# Patient Record
Sex: Male | Born: 1935 | Race: White | Hispanic: No | Smoking: Former smoker
Health system: Southern US, Community
[De-identification: ages and names within clinical notes are randomized; demographics above are authoritative.]

## PROBLEM LIST (undated history)

## (undated) DIAGNOSIS — E039 Hypothyroidism, unspecified: Secondary | ICD-10-CM

## (undated) DIAGNOSIS — I10 Essential (primary) hypertension: Secondary | ICD-10-CM

## (undated) DIAGNOSIS — I3139 Other pericardial effusion (noninflammatory): Secondary | ICD-10-CM

## (undated) DIAGNOSIS — N183 Chronic kidney disease, stage 3 unspecified: Secondary | ICD-10-CM

## (undated) DIAGNOSIS — Z9289 Personal history of other medical treatment: Secondary | ICD-10-CM

## (undated) DIAGNOSIS — D649 Anemia, unspecified: Secondary | ICD-10-CM

## (undated) DIAGNOSIS — N182 Chronic kidney disease, stage 2 (mild): Secondary | ICD-10-CM

## (undated) DIAGNOSIS — M1990 Unspecified osteoarthritis, unspecified site: Secondary | ICD-10-CM

## (undated) DIAGNOSIS — I313 Pericardial effusion (noninflammatory): Secondary | ICD-10-CM

## (undated) DIAGNOSIS — J189 Pneumonia, unspecified organism: Secondary | ICD-10-CM

## (undated) DIAGNOSIS — E7800 Pure hypercholesterolemia, unspecified: Secondary | ICD-10-CM

## (undated) DIAGNOSIS — I314 Cardiac tamponade: Secondary | ICD-10-CM

## (undated) DIAGNOSIS — J449 Chronic obstructive pulmonary disease, unspecified: Secondary | ICD-10-CM

## (undated) DIAGNOSIS — Z9981 Dependence on supplemental oxygen: Secondary | ICD-10-CM

## (undated) HISTORY — DX: Chronic kidney disease, stage 3 unspecified: N18.30

## (undated) HISTORY — DX: Cardiac tamponade: I31.4

## (undated) HISTORY — PX: FRACTURE SURGERY: SHX138

## (undated) HISTORY — PX: MANDIBULAR HARDWARE REMOVAL: SHX5205

## (undated) HISTORY — DX: Pericardial effusion (noninflammatory): I31.3

## (undated) HISTORY — DX: Other pericardial effusion (noninflammatory): I31.39

## (undated) HISTORY — PX: CATARACT EXTRACTION W/ INTRAOCULAR LENS IMPLANT: SHX1309

---

## 1978-08-07 HISTORY — PX: ORIF MANDIBULAR FRACTURE: SHX2127

## 2015-08-08 DIAGNOSIS — Z9289 Personal history of other medical treatment: Secondary | ICD-10-CM | POA: Insufficient documentation

## 2015-08-08 HISTORY — DX: Personal history of other medical treatment: Z92.89

## 2016-05-01 DIAGNOSIS — E785 Hyperlipidemia, unspecified: Secondary | ICD-10-CM | POA: Insufficient documentation

## 2016-05-01 HISTORY — DX: Hyperlipidemia, unspecified: E78.5

## 2018-02-07 DIAGNOSIS — E039 Hypothyroidism, unspecified: Secondary | ICD-10-CM

## 2018-02-07 DIAGNOSIS — I10 Essential (primary) hypertension: Secondary | ICD-10-CM | POA: Diagnosis not present

## 2018-02-07 DIAGNOSIS — R5383 Other fatigue: Secondary | ICD-10-CM

## 2018-02-07 DIAGNOSIS — J9601 Acute respiratory failure with hypoxia: Secondary | ICD-10-CM

## 2018-02-07 DIAGNOSIS — N183 Chronic kidney disease, stage 3 (moderate): Secondary | ICD-10-CM

## 2018-02-07 DIAGNOSIS — J181 Lobar pneumonia, unspecified organism: Secondary | ICD-10-CM

## 2018-02-08 DIAGNOSIS — N183 Chronic kidney disease, stage 3 (moderate): Secondary | ICD-10-CM | POA: Diagnosis not present

## 2018-02-08 DIAGNOSIS — I10 Essential (primary) hypertension: Secondary | ICD-10-CM | POA: Diagnosis not present

## 2018-02-08 DIAGNOSIS — E039 Hypothyroidism, unspecified: Secondary | ICD-10-CM | POA: Diagnosis not present

## 2018-02-08 DIAGNOSIS — R5383 Other fatigue: Secondary | ICD-10-CM | POA: Diagnosis not present

## 2018-02-09 DIAGNOSIS — E039 Hypothyroidism, unspecified: Secondary | ICD-10-CM | POA: Diagnosis not present

## 2018-02-09 DIAGNOSIS — I10 Essential (primary) hypertension: Secondary | ICD-10-CM | POA: Diagnosis not present

## 2018-02-09 DIAGNOSIS — N183 Chronic kidney disease, stage 3 (moderate): Secondary | ICD-10-CM | POA: Diagnosis not present

## 2018-02-09 DIAGNOSIS — R5383 Other fatigue: Secondary | ICD-10-CM | POA: Diagnosis not present

## 2018-02-10 DIAGNOSIS — I10 Essential (primary) hypertension: Secondary | ICD-10-CM | POA: Diagnosis not present

## 2018-02-10 DIAGNOSIS — E039 Hypothyroidism, unspecified: Secondary | ICD-10-CM | POA: Diagnosis not present

## 2018-02-10 DIAGNOSIS — R5383 Other fatigue: Secondary | ICD-10-CM | POA: Diagnosis not present

## 2018-02-10 DIAGNOSIS — N183 Chronic kidney disease, stage 3 (moderate): Secondary | ICD-10-CM | POA: Diagnosis not present

## 2018-05-07 ENCOUNTER — Inpatient Hospital Stay (HOSPITAL_COMMUNITY)
Admission: AD | Disposition: A | Discharge status: Home / Self Care | Payer: Self-pay | Source: Other Acute Inpatient Hospital | Attending: Internal Medicine

## 2018-05-07 ENCOUNTER — Encounter (HOSPITAL_COMMUNITY): Payer: Self-pay | Admitting: Internal Medicine

## 2018-05-07 ENCOUNTER — Inpatient Hospital Stay (HOSPITAL_COMMUNITY): Payer: Medicare HMO

## 2018-05-07 ENCOUNTER — Inpatient Hospital Stay (HOSPITAL_COMMUNITY)
Admission: AD | Admit: 2018-05-07 | Discharge: 2018-05-09 | DRG: 315 | Disposition: A | Discharge status: Home / Self Care | Payer: Medicare HMO | Source: Other Acute Inpatient Hospital | Attending: Internal Medicine | Admitting: Internal Medicine

## 2018-05-07 ENCOUNTER — Other Ambulatory Visit: Payer: Self-pay

## 2018-05-07 DIAGNOSIS — N183 Chronic kidney disease, stage 3 (moderate): Secondary | ICD-10-CM | POA: Diagnosis present

## 2018-05-07 DIAGNOSIS — I129 Hypertensive chronic kidney disease with stage 1 through stage 4 chronic kidney disease, or unspecified chronic kidney disease: Secondary | ICD-10-CM | POA: Diagnosis present

## 2018-05-07 DIAGNOSIS — I4891 Unspecified atrial fibrillation: Secondary | ICD-10-CM | POA: Diagnosis present

## 2018-05-07 DIAGNOSIS — Z9981 Dependence on supplemental oxygen: Secondary | ICD-10-CM | POA: Diagnosis not present

## 2018-05-07 DIAGNOSIS — I10 Essential (primary) hypertension: Secondary | ICD-10-CM | POA: Diagnosis not present

## 2018-05-07 DIAGNOSIS — Z87891 Personal history of nicotine dependence: Secondary | ICD-10-CM

## 2018-05-07 DIAGNOSIS — I313 Pericardial effusion (noninflammatory): Secondary | ICD-10-CM

## 2018-05-07 DIAGNOSIS — Z8249 Family history of ischemic heart disease and other diseases of the circulatory system: Secondary | ICD-10-CM | POA: Diagnosis not present

## 2018-05-07 DIAGNOSIS — J449 Chronic obstructive pulmonary disease, unspecified: Secondary | ICD-10-CM | POA: Diagnosis present

## 2018-05-07 DIAGNOSIS — Z79890 Hormone replacement therapy (postmenopausal): Secondary | ICD-10-CM

## 2018-05-07 DIAGNOSIS — Y636 Underdosing and nonadministration of necessary drug, medicament or biological substance: Secondary | ICD-10-CM | POA: Diagnosis not present

## 2018-05-07 DIAGNOSIS — E039 Hypothyroidism, unspecified: Secondary | ICD-10-CM | POA: Diagnosis present

## 2018-05-07 DIAGNOSIS — N179 Acute kidney failure, unspecified: Secondary | ICD-10-CM | POA: Diagnosis present

## 2018-05-07 DIAGNOSIS — I3139 Other pericardial effusion (noninflammatory): Secondary | ICD-10-CM

## 2018-05-07 DIAGNOSIS — Z961 Presence of intraocular lens: Secondary | ICD-10-CM | POA: Diagnosis not present

## 2018-05-07 DIAGNOSIS — I314 Cardiac tamponade: Secondary | ICD-10-CM | POA: Diagnosis present

## 2018-05-07 DIAGNOSIS — Z9841 Cataract extraction status, right eye: Secondary | ICD-10-CM | POA: Diagnosis not present

## 2018-05-07 DIAGNOSIS — E785 Hyperlipidemia, unspecified: Secondary | ICD-10-CM | POA: Diagnosis present

## 2018-05-07 DIAGNOSIS — G4700 Insomnia, unspecified: Secondary | ICD-10-CM | POA: Diagnosis present

## 2018-05-07 DIAGNOSIS — J9611 Chronic respiratory failure with hypoxia: Secondary | ICD-10-CM | POA: Diagnosis present

## 2018-05-07 DIAGNOSIS — I480 Paroxysmal atrial fibrillation: Secondary | ICD-10-CM | POA: Diagnosis not present

## 2018-05-07 DIAGNOSIS — Z79899 Other long term (current) drug therapy: Secondary | ICD-10-CM

## 2018-05-07 HISTORY — DX: Pericardial effusion (noninflammatory): I31.3

## 2018-05-07 HISTORY — DX: Essential (primary) hypertension: I10

## 2018-05-07 HISTORY — DX: Dependence on supplemental oxygen: Z99.81

## 2018-05-07 HISTORY — DX: Personal history of other medical treatment: Z92.89

## 2018-05-07 HISTORY — DX: Pneumonia, unspecified organism: J18.9

## 2018-05-07 HISTORY — DX: Other pericardial effusion (noninflammatory): I31.39

## 2018-05-07 HISTORY — DX: Anemia, unspecified: D64.9

## 2018-05-07 HISTORY — DX: Chronic obstructive pulmonary disease, unspecified: J44.9

## 2018-05-07 HISTORY — DX: Pure hypercholesterolemia, unspecified: E78.00

## 2018-05-07 HISTORY — DX: Hypothyroidism, unspecified: E03.9

## 2018-05-07 HISTORY — PX: PERICARDIOCENTESIS: CATH118255

## 2018-05-07 HISTORY — DX: Unspecified osteoarthritis, unspecified site: M19.90

## 2018-05-07 HISTORY — DX: Chronic kidney disease, stage 2 (mild): N18.2

## 2018-05-07 LAB — BODY FLUID CELL COUNT WITH DIFFERENTIAL
Eos, Fluid: 0 %
Lymphs, Fluid: 25 %
Monocyte-Macrophage-Serous Fluid: 42 % — ABNORMAL LOW (ref 50–90)
Neutrophil Count, Fluid: 33 % — ABNORMAL HIGH (ref 0–25)
Total Nucleated Cell Count, Fluid: 235 cu mm (ref 0–1000)

## 2018-05-07 LAB — ECHOCARDIOGRAM LIMITED

## 2018-05-07 LAB — GRAM STAIN

## 2018-05-07 LAB — T4, FREE: Free T4: 0.54 ng/dL — ABNORMAL LOW (ref 0.82–1.77)

## 2018-05-07 LAB — TSH: TSH: 33.739 u[IU]/mL — AB (ref 0.350–4.500)

## 2018-05-07 LAB — MRSA PCR SCREENING: MRSA by PCR: NEGATIVE

## 2018-05-07 SURGERY — PERICARDIOCENTESIS
Anesthesia: LOCAL

## 2018-05-07 MED ORDER — AMLODIPINE BESYLATE 5 MG PO TABS
5.0000 mg | ORAL_TABLET | Freq: Every day | ORAL | Status: DC
  Administered 2018-05-08 – 2018-05-09 (×2): 5 mg via ORAL
  Filled 2018-05-07 (×2): qty 1

## 2018-05-07 MED ORDER — FOLIC ACID 1 MG PO TABS
1.0000 mg | ORAL_TABLET | Freq: Every day | ORAL | Status: DC
  Administered 2018-05-08 – 2018-05-09 (×2): 1 mg via ORAL
  Filled 2018-05-07 (×2): qty 1

## 2018-05-07 MED ORDER — SODIUM CHLORIDE 0.9 % IV SOLN: INTRAVENOUS | Status: DC

## 2018-05-07 MED ORDER — LACTATED RINGERS IV SOLN
INTRAVENOUS | Status: DC
  Administered 2018-05-07 – 2018-05-08 (×3): via INTRAVENOUS
  Administered 2018-05-09: 1000 mL via INTRAVENOUS

## 2018-05-07 MED ORDER — DOCUSATE SODIUM 100 MG PO CAPS
100.0000 mg | ORAL_CAPSULE | Freq: Two times a day (BID) | ORAL | Status: DC
  Administered 2018-05-08 (×2): 100 mg via ORAL
  Filled 2018-05-07 (×3): qty 1

## 2018-05-07 MED ORDER — ONDANSETRON HCL 4 MG PO TABS: 4.0000 mg | ORAL_TABLET | Freq: Four times a day (QID) | ORAL | Status: DC | PRN

## 2018-05-07 MED ORDER — ACETAMINOPHEN 650 MG RE SUPP: 650.0000 mg | Freq: Four times a day (QID) | RECTAL | Status: DC | PRN

## 2018-05-07 MED ORDER — ORAL CARE MOUTH RINSE
15.0000 mL | Freq: Two times a day (BID) | OROMUCOSAL | Status: DC
  Administered 2018-05-07 – 2018-05-09 (×4): 15 mL via OROMUCOSAL

## 2018-05-07 MED ORDER — MIDAZOLAM HCL 2 MG/2ML IJ SOLN
INTRAMUSCULAR | Status: AC
  Filled 2018-05-07: qty 2

## 2018-05-07 MED ORDER — SODIUM CHLORIDE 0.9% FLUSH
3.0000 mL | Freq: Two times a day (BID) | INTRAVENOUS | Status: DC
  Administered 2018-05-07 – 2018-05-09 (×4): 3 mL via INTRAVENOUS

## 2018-05-07 MED ORDER — MIDAZOLAM HCL 2 MG/2ML IJ SOLN
INTRAMUSCULAR | Status: DC | PRN
  Administered 2018-05-07: 1 mg via INTRAVENOUS

## 2018-05-07 MED ORDER — HEPARIN (PORCINE) IN NACL 1000-0.9 UT/500ML-% IV SOLN
INTRAVENOUS | Status: AC
  Filled 2018-05-07: qty 500

## 2018-05-07 MED ORDER — LIDOCAINE HCL (PF) 1 % IJ SOLN
INTRAMUSCULAR | Status: DC | PRN
  Administered 2018-05-07: 10 mL

## 2018-05-07 MED ORDER — LIDOCAINE HCL (PF) 1 % IJ SOLN
INTRAMUSCULAR | Status: AC
  Filled 2018-05-07: qty 30

## 2018-05-07 MED ORDER — SODIUM CHLORIDE 0.9 % IV SOLN: 250.0000 mL | INTRAVENOUS | Status: DC | PRN

## 2018-05-07 MED ORDER — ALBUTEROL SULFATE (2.5 MG/3ML) 0.083% IN NEBU: 3.0000 mL | INHALATION_SOLUTION | Freq: Four times a day (QID) | RESPIRATORY_TRACT | Status: DC | PRN

## 2018-05-07 MED ORDER — LEVOTHYROXINE SODIUM 112 MCG PO TABS
112.0000 ug | ORAL_TABLET | Freq: Every day | ORAL | Status: DC
  Administered 2018-05-08 – 2018-05-09 (×2): 112 ug via ORAL
  Filled 2018-05-07 (×2): qty 1

## 2018-05-07 MED ORDER — SODIUM CHLORIDE 0.9% FLUSH: 3.0000 mL | INTRAVENOUS | Status: DC | PRN

## 2018-05-07 MED ORDER — ONDANSETRON HCL 4 MG/2ML IJ SOLN: 4.0000 mg | Freq: Four times a day (QID) | INTRAMUSCULAR | Status: DC | PRN

## 2018-05-07 MED ORDER — ACETAMINOPHEN 325 MG PO TABS: 650.0000 mg | ORAL_TABLET | Freq: Four times a day (QID) | ORAL | Status: DC | PRN

## 2018-05-07 SURGICAL SUPPLY — 4 items
EVACUATOR 1/8 PVC DRAIN (DRAIN) ×2 IMPLANT
PACK CARDIAC CATHETERIZATION (CUSTOM PROCEDURE TRAY) ×2 IMPLANT
PERIVAC PERICARDIOCENTESIS 8.3 (TRAY / TRAY PROCEDURE) ×2 IMPLANT
SHEATH PROBE COVER 6X72 (BAG) ×2 IMPLANT
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
## 2018-05-07 NOTE — Progress Notes (Signed)
Pt arrived via [facility]. Pt on o2 via [facility] @ 2 lpm. Sat 92. Vitals stable. CCMD notified telebox 14 applied. CHG bath given. Pt belongings at bedside with son. Keith Nelson

## 2018-05-07 NOTE — Progress Notes (Signed)
  Echocardiogram 2D Echocardiogram has been performed.  Keith Nelson 05/07/2018, 6:43 PM

## 2018-05-07 NOTE — H&P (View-Only) (Signed)
Cardiology Consultation:   Patient ID: Keith Nelson MRN: [id number]; DOB: 09/20/1929  Admit date: 05/07/2018 Date of Consult: 05/07/2018  Primary Care Provider: York, Regina F, NP Primary Cardiologist: Robert Krasowski, MD  Primary Electrophysiologist:  None    Patient Profile:   Keith Nelson is a 84 y.o. male with a hx of COPD, HLD and HTN who is being seen today for the evaluation of pericardial effusion at the request of Dr. Yates.  History of Present Illness:   Keith Nelson was last seen by Dr. Krasowski in clinic on 05/01/16. At that time, he underwent CT chest for chest pain and found to have "significant" pericardial effusion. Options were discussed with the patient in clinic, including drain placement. The patient preferred conservative management with repeat echo in 1 month. He had no signs of tamponade in clinic. I can't see these results in Care Everywhere.  Home meds include norvasc and ASA 325 mg. He is a former smoker.   Pt presented to [facility] today with a 1 month history of insomnia and anorexia. Thyroid hormone abnormal because he stopped taking his medication about a month ago (accidentally). He restarted medication last weekend. He has a history of PNA in July of this year. Since discharge, he has had increased shortness of breath and productive cough.  He has chest tightness when he coughs and discharges sputum. He reported to [facility] because he thought he was having a recurrence of PNA. CT chest there showed pericardial effusion and he was transferred to [facility].   On my interview, his blood pressure has been elevated. He reports chest tightness associated with cough.    Past Medical History:  Diagnosis Date  . Anemia   . COPD (chronic obstructive pulmonary disease) (HCC)   . Hypertension   . Hypothyroidism   . Pericardial effusion 05/07/2018    Past Surgical History:  Procedure Laterality Date  . ORIF MANDIBULAR FRACTURE        Home Medications:  Prior to Admission medications   Medication Sig Start Date End Date Taking? Authorizing Provider  amLODipine (NORVASC) 5 MG tablet Take 5 mg by mouth daily.   Yes [provider]  levothyroxine (SYNTHROID, LEVOTHROID) 100 MCG tablet Take 100 mcg by mouth daily before breakfast.   Yes [provider]    Inpatient Medications: Scheduled Meds: . docusate sodium  100 mg Oral BID  . sodium chloride flush  3 mL Intravenous Q12H   Continuous Infusions: . lactated ringers     PRN Meds: acetaminophen **OR** acetaminophen, ondansetron **OR** ondansetron (ZOFRAN) IV  Allergies:   Allergies not on file  Social History:   Social History   Socioeconomic History  . Marital status: Widowed    Spouse name: Not on file  . Number of children: Not on file  . Years of education: Not on file  . Highest education level: Not on file  Occupational History  . Occupation: drives bus  Social Needs  . Financial resource strain: Not on file  . Food insecurity:    Worry: Not on file    Inability: Not on file  . Transportation needs:    Medical: Not on file    Non-medical: Not on file  Tobacco Use  . Smoking status: Former Smoker    Last attempt to quit: 1980    Years since quitting: 39.7  . Smokeless tobacco: Never Used  Substance and Sexual Activity  . Alcohol use: Never    Frequency: Never  .   Drug use: Never  . Sexual activity: Not on file  Lifestyle  . Physical activity:    Days per week: Not on file    Minutes per session: Not on file  . Stress: Not on file  Relationships  . Social connections:    Talks on phone: Not on file    Gets together: Not on file    Attends religious service: Not on file    Active member of club or organization: Not on file    Attends meetings of clubs or organizations: Not on file    Relationship status: Not on file  . Intimate partner violence:    Fear of current or ex partner: Not on file    Emotionally abused:  Not on file    Physically abused: Not on file    Forced sexual activity: Not on file  Other Topics Concern  . Not on file  Social History Narrative  . Not on file    Family History:    Family History  Problem Relation Age of Onset  . Heart failure Mother      ROS:  Please see the history of present illness.   All other ROS reviewed and negative.     Physical Exam/Data:   Vitals:   05/07/18 1448 05/07/18 1635  BP: (!) 167/93 (!) 178/94  Pulse: 79 76  Resp: 18 16  Temp: 97.9 F (36.6 C) (!) 97.5 F (36.4 C)  TempSrc: Oral Oral  SpO2: 92% 90%    Intake/Output Summary (Last 24 hours) at 05/07/2018 1707 Last data filed at 05/07/2018 1637 Gross per 24 hour  Intake -  Output 400 ml  Net -400 ml   There were no vitals filed for this visit. There is no height or weight on file to calculate BMI.  General:  Well nourished, well developed, in no acute distress HEENT: normal Neck: no JVD Vascular: No carotid bruits Cardiac:  normal S1, S2; RRR; no murmur Lungs:  Respirations are unlabored, scattered wheezes in bases Abd: soft, nontender, no hepatomegaly  Ext: no edema Musculoskeletal:  No deformities, BUE and BLE strength normal and equal Skin: warm and dry  Neuro:  CNs 2-12 intact, no focal abnormalities noted Psych:  Normal affect   EKG:  The EKG was personally reviewed and demonstrates:  pending Telemetry:  Telemetry was personally reviewed and demonstrates:  Sinus with PVCs  Relevant CV Studies:  Echo pending read  Laboratory Data:  ChemistryNo results for input(s): NA, K, CL, CO2, GLUCOSE, BUN, CREATININE, CALCIUM, GFRNONAA, GFRAA, ANIONGAP in the last 168 hours.  No results for input(s): PROT, ALBUMIN, AST, ALT, ALKPHOS, BILITOT in the last 168 hours. HematologyNo results for input(s): WBC, RBC, HGB, HCT, MCV, MCH, MCHC, RDW, PLT in the last 168 hours. Cardiac EnzymesNo results for input(s): TROPONINI in the last 168 hours. No results for input(s): TROPIPOC  in the last 168 hours.  BNPNo results for input(s): BNP, PROBNP in the last 168 hours.  DDimer No results for input(s): DDIMER in the last 168 hours.  Radiology/Studies:  No results found.  Assessment and Plan:   1. Pericardial effusion, incidental finding on CT chest - echo final read pending - reviewed with attending - large pericardial effusion - pt hypertensive, sinus rhythm in the 80s - will benefit from pericardiocentesis - will discuss with attending - INR 1.2, Hb 14.2, on 325 mg ASA   2. Chest tightness with cough - pt states he has chest tightness with his productive cough, denies   chest tightness at other times - unclear if he has had a recent ischemic evaluation through [facility] - will workup effusion first, but may need an ischemic evaluation   3. Thyroid disease - he forgot to pick up his thyroid medicine and hasn't taken it in a month - he resumed last weekend - may be related to his insomnia, anorexia, and hypertension - per primary   4. Hypertension - home meds include norvasc - pressure 178/94 - hold off on PRN medications until after procedure    Plan for pericardiocentesis today.      For questions or updates, please contact [facility] Please consult [email or web address] for contact info under     Signed, Angela Nicole Duke, PA  05/07/2018 5:07 PM   I have examined the patient and reviewed assessment and plan and discussed with patient.  Agree with above as stated.  I personally reviewed the echo images.  Patient with evidence of RV diastolic collapse.  Risks and benefits of pericardiocentesis explained to the patient and family.  They are agreeable.    Patient is very hypertensive.  No BP meds until effusion drained as I do not want to risk inducing hypotension in the setting of tamponade.   D/w Dr. Gerhardt and Dr. Copper.  Pericardiocentesis would be a better choice rather than window to avoid general anesthesia.  Critical care time 40  minutes  Mujahid Jalomo   

## 2018-05-07 NOTE — Interval H&P Note (Signed)
History and Physical Interval Note:  05/07/2018 6:11 PM  Keith Nelson  has presented today for surgery, with the diagnosis of pericardiocentesis  The various methods of treatment have been discussed with the patient and family. After consideration of risks, benefits and other options for treatment, the patient has consented to  Procedure(s): PERICARDIOCENTESIS (N/A) as a surgical intervention .  The patient's history has been reviewed, patient examined, no change in status, stable for surgery.  I have reviewed the patient's chart and labs.  Questions were answered to the patient's satisfaction.     Tonny Bollman

## 2018-05-07 NOTE — Consult Note (Addendum)
Cardiology Consultation:   Patient ID: Keith Nelson MRN: [id number]; DOB: 09-27-1929  Admit date: 05/07/2018 Date of Consult: 05/07/2018  Primary Care Provider: [name], NP Primary Cardiologist: Gypsy Balsam, MD  Primary Electrophysiologist:  None    Patient Profile:   Keith Nelson is a 84 y.o. male with a hx of COPD, HLD and HTN who is being seen today for the evaluation of pericardial effusion at the request of Dr. Ophelia Charter.  History of Present Illness:   Keith Nelson was last seen by Dr. Bing Matter in clinic on 05/01/16. At that time, he underwent CT chest for chest pain and found to have "significant" pericardial effusion. Options were discussed with the patient in clinic, including drain placement. The patient preferred conservative management with repeat echo in 1 month. He had no signs of tamponade in clinic. I can't see these results in Care Everywhere.  Home meds include norvasc and ASA 325 mg. He is a former smoker.   Pt presented to [facility] today with a 1 month history of insomnia and anorexia. Thyroid hormone abnormal because he stopped taking his medication about a month ago (accidentally). He restarted medication last weekend. He has a history of PNA in July of this year. Since discharge, he has had increased shortness of breath and productive cough.  He has chest tightness when he coughs and discharges sputum. He reported to [facility] because he thought he was having a recurrence of PNA. CT chest there showed pericardial effusion and he was transferred to [facility].   On my interview, his blood pressure has been elevated. He reports chest tightness associated with cough.    Past Medical History:  Diagnosis Date  . Anemia   . COPD (chronic obstructive pulmonary disease) (HCC)   . Hypertension   . Hypothyroidism   . Pericardial effusion 05/07/2018    Past Surgical History:  Procedure Laterality Date  . ORIF MANDIBULAR FRACTURE        Home Medications:  Prior to Admission medications   Medication Sig Start Date End Date Taking? Authorizing Provider  amLODipine (NORVASC) 5 MG tablet Take 5 mg by mouth daily.   Yes [provider]  levothyroxine (SYNTHROID, LEVOTHROID) 100 MCG tablet Take 100 mcg by mouth daily before breakfast.   Yes [provider]    Inpatient Medications: Scheduled Meds: . docusate sodium  100 mg Oral BID  . sodium chloride flush  3 mL Intravenous Q12H   Continuous Infusions: . lactated ringers     PRN Meds: acetaminophen **OR** acetaminophen, ondansetron **OR** ondansetron (ZOFRAN) IV  Allergies:   Allergies not on file  Social History:   Social History   Socioeconomic History  . Marital status: Widowed    Spouse name: Not on file  . Number of children: Not on file  . Years of education: Not on file  . Highest education level: Not on file  Occupational History  . Occupation: drives bus  Social Needs  . Financial resource strain: Not on file  . Food insecurity:    Worry: Not on file    Inability: Not on file  . Transportation needs:    Medical: Not on file    Non-medical: Not on file  Tobacco Use  . Smoking status: Former Smoker    Last attempt to quit: 1980    Years since quitting: 39.7  . Smokeless tobacco: Never Used  Substance and Sexual Activity  . Alcohol use: Never    Frequency: Never  .  Drug use: Never  . Sexual activity: Not on file  Lifestyle  . Physical activity:    Days per week: Not on file    Minutes per session: Not on file  . Stress: Not on file  Relationships  . Social connections:    Talks on phone: Not on file    Gets together: Not on file    Attends religious service: Not on file    Active member of club or organization: Not on file    Attends meetings of clubs or organizations: Not on file    Relationship status: Not on file  . Intimate partner violence:    Fear of current or ex partner: Not on file    Emotionally abused:  Not on file    Physically abused: Not on file    Forced sexual activity: Not on file  Other Topics Concern  . Not on file  Social History Narrative  . Not on file    Family History:    Family History  Problem Relation Age of Onset  . Heart failure Mother      ROS:  Please see the history of present illness.   All other ROS reviewed and negative.     Physical Exam/Data:   Vitals:   05/07/18 1448 05/07/18 1635  BP: (!) 167/93 (!) 178/94  Pulse: 79 76  Resp: 18 16  Temp: 97.9 F (36.6 C) (!) 97.5 F (36.4 C)  TempSrc: Oral Oral  SpO2: 92% 90%    Intake/Output Summary (Last 24 hours) at 05/07/2018 1707 Last data filed at 05/07/2018 1637 Gross per 24 hour  Intake -  Output 400 ml  Net -400 ml   There were no vitals filed for this visit. There is no height or weight on file to calculate BMI.  General:  Well nourished, well developed, in no acute distress HEENT: normal Neck: no JVD Vascular: No carotid bruits Cardiac:  normal S1, S2; RRR; no murmur Lungs:  Respirations are unlabored, scattered wheezes in bases Abd: soft, nontender, no hepatomegaly  Ext: no edema Musculoskeletal:  No deformities, BUE and BLE strength normal and equal Skin: warm and dry  Neuro:  CNs 2-12 intact, no focal abnormalities noted Psych:  Normal affect   EKG:  The EKG was personally reviewed and demonstrates:  pending Telemetry:  Telemetry was personally reviewed and demonstrates:  Sinus with PVCs  Relevant CV Studies:  Echo pending read  Laboratory Data:  ChemistryNo results for input(s): NA, K, CL, CO2, GLUCOSE, BUN, CREATININE, CALCIUM, GFRNONAA, GFRAA, ANIONGAP in the last 168 hours.  No results for input(s): PROT, ALBUMIN, AST, ALT, ALKPHOS, BILITOT in the last 168 hours. HematologyNo results for input(s): WBC, RBC, HGB, HCT, MCV, MCH, MCHC, RDW, PLT in the last 168 hours. Cardiac EnzymesNo results for input(s): TROPONINI in the last 168 hours. No results for input(s): TROPIPOC  in the last 168 hours.  BNPNo results for input(s): BNP, PROBNP in the last 168 hours.  DDimer No results for input(s): DDIMER in the last 168 hours.  Radiology/Studies:  No results found.  Assessment and Plan:   1. Pericardial effusion, incidental finding on CT chest - echo final read pending - reviewed with attending - large pericardial effusion - pt hypertensive, sinus rhythm in the 80s - will benefit from pericardiocentesis - will discuss with attending - INR 1.2, Hb 14.2, on 325 mg ASA   2. Chest tightness with cough - pt states he has chest tightness with his productive cough, denies  chest tightness at other times - unclear if he has had a recent ischemic evaluation through [facility] - will workup effusion first, but may need an ischemic evaluation   3. Thyroid disease - he forgot to pick up his thyroid medicine and hasn't taken it in a month - he resumed last weekend - may be related to his insomnia, anorexia, and hypertension - per primary   4. Hypertension - home meds include norvasc - pressure 178/94 - hold off on PRN medications until after procedure    Plan for pericardiocentesis today.      For questions or updates, please contact [facility] Please consult [email or web address] for contact info under     Signed, Marcelino Duster, PA  05/07/2018 5:07 PM   I have examined the patient and reviewed assessment and plan and discussed with patient.  Agree with above as stated.  I personally reviewed the echo images.  Patient with evidence of RV diastolic collapse.  Risks and benefits of pericardiocentesis explained to the patient and family.  They are agreeable.    Patient is very hypertensive.  No BP meds until effusion drained as I do not want to risk inducing hypotension in the setting of tamponade.   D/w Dr. Tyrone Sage and Dr. Copper.  Pericardiocentesis would be a better choice rather than window to avoid general anesthesia.  Critical care time 40  minutes  Lance Muss

## 2018-05-07 NOTE — Progress Notes (Addendum)
  Echocardiogram 2D Echocardiogram has been performed.  Large pericardial effusion noticed.  Delcie Roch 05/07/2018, 4:33 PM

## 2018-05-08 ENCOUNTER — Encounter (HOSPITAL_COMMUNITY): Payer: Self-pay | Admitting: Cardiovascular Disease

## 2018-05-08 DIAGNOSIS — I314 Cardiac tamponade: Secondary | ICD-10-CM | POA: Diagnosis present

## 2018-05-08 DIAGNOSIS — J449 Chronic obstructive pulmonary disease, unspecified: Secondary | ICD-10-CM

## 2018-05-08 HISTORY — DX: Cardiac tamponade: I31.4

## 2018-05-08 LAB — BASIC METABOLIC PANEL
Anion gap: 11 (ref 5–15)
BUN: 14 mg/dL (ref 8–23)
CHLORIDE: 94 mmol/L — AB (ref 98–111)
CO2: 28 mmol/L (ref 22–32)
Calcium: 9.1 mg/dL (ref 8.9–10.3)
Creatinine, Ser: 1.36 mg/dL — ABNORMAL HIGH (ref 0.61–1.24)
GFR calc non Af Amer: 46 mL/min — ABNORMAL LOW (ref >60)
GFR, EST AFRICAN AMERICAN: 53 mL/min — AB (ref >60)
Glucose, Bld: 138 mg/dL — ABNORMAL HIGH (ref 70–99)
POTASSIUM: 4.1 mmol/L (ref 3.5–5.1)
Sodium: 133 mmol/L — ABNORMAL LOW (ref 135–145)

## 2018-05-08 LAB — CBC
HCT: 45.3 % (ref 39.0–52.0)
Hemoglobin: 14.5 g/dL (ref 13.0–17.0)
MCH: 29.5 pg (ref 26.0–34.0)
MCHC: 32 g/dL (ref 30.0–36.0)
MCV: 92.1 fL (ref 78.0–100.0)
PLATELETS: 201 10*3/uL (ref 150–400)
RBC: 4.92 MIL/uL (ref 4.22–5.81)
RDW: 13.7 % (ref 11.5–15.5)
WBC: 7.7 10*3/uL (ref 4.0–10.5)

## 2018-05-08 LAB — PH, BODY FLUID: PH, BODY FLUID: 7.7

## 2018-05-08 LAB — ECHOCARDIOGRAM LIMITED

## 2018-05-08 LAB — GLUCOSE, BODY FLUID OTHER: Glucose, Body Fluid Other: 101 mg/dL

## 2018-05-08 LAB — PROTEIN, BODY FLUID (OTHER): TOTAL PROTEIN, BODY FLUID OTHER: 6.9 g/dL

## 2018-05-08 LAB — LD, BODY FLUID (OTHER): LD, BODY FLUID: 165 IU/L

## 2018-05-08 MED FILL — Heparin Sod (Porcine)-NaCl IV Soln 1000 Unit/500ML-0.9%: INTRAVENOUS | Qty: 500 | Status: AC

## 2018-05-08 NOTE — Progress Notes (Addendum)
Progress Note  Patient Name: Keith Nelson Date of Encounter: 05/08/2018  Primary Cardiologist: Gypsy Balsam, MD  Subjective   Patient feeling better today. Chest pain has resolved. No SOB.  Inpatient Medications    Scheduled Meds: . amLODipine  5 mg Oral Daily  . docusate sodium  100 mg Oral BID  . folic acid  1 mg Oral Daily  . levothyroxine  112 mcg Oral QAC breakfast  . mouth rinse  15 mL Mouth Rinse BID  . sodium chloride flush  3 mL Intravenous Q12H  . sodium chloride flush  3 mL Intravenous Q12H   Continuous Infusions: . sodium chloride    . sodium chloride    . lactated ringers 75 mL/hr at 05/08/18 1000   PRN Meds: sodium chloride, acetaminophen **OR** acetaminophen, albuterol, ondansetron **OR** ondansetron (ZOFRAN) IV, sodium chloride flush   Vital Signs    Vitals:   05/08/18 0800 05/08/18 0900 05/08/18 0934 05/08/18 1000  BP: (!) 151/82 (!) 141/88 (!) 141/88 (!) 156/68  Pulse: 66 65  (!) 56  Resp: 18 (!) 26  15  Temp:      TempSrc:      SpO2: 90% 94%  91%  Weight:      Height:        Intake/Output Summary (Last 24 hours) at 05/08/2018 1117 Last data filed at 05/08/2018 1000 Gross per 24 hour  Intake 953.04 ml  Output 2045 ml  Net -1091.96 ml   Filed Weights   05/07/18 1812  Weight: 81 kg    Telemetry    NSR, low PVC burden, one 6 beat run of V-tach - Personally Reviewed  ECG    N/A - Personally Reviewed  Physical Exam   GEN: No acute distress.   Neck: No JVD Cardiac: RRR, no murmurs, rubs, or gallops; Pericardial drain in place Respiratory: Mild rhonchi and expiratory wheezes bilaterally GI: Soft, nontender, non-distended  MS: No edema; No deformity; clean dry pericardiocentesis site. Neuro:  Nonfocal  Psych: Normal affect   Labs    Chemistry Recent Labs  Lab 05/08/18 0219  NA 133*  K 4.1  CL 94*  CO2 28  GLUCOSE 138*  BUN 14  CREATININE 1.36*  CALCIUM 9.1  GFRNONAA 46*  GFRAA 53*  ANIONGAP 11      Hematology Recent Labs  Lab 05/08/18 0219  WBC 7.7  RBC 4.92  HGB 14.5  HCT 45.3  MCV 92.1  MCH 29.5  MCHC 32.0  RDW 13.7  PLT 201    Cardiac EnzymesNo results for input(s): TROPONINI in the last 168 hours. No results for input(s): TROPIPOC in the last 168 hours.   BNPNo results for input(s): BNP, PROBNP in the last 168 hours.   DDimer No results for input(s): DDIMER in the last 168 hours.   Radiology    No results found.  Cardiac Studies   Pericardiocentesis 10/1 Pericardiocentesis performed by the subxiphoid approach with fluoro and ultrasound. 1100 mL of serous fluid was removed from the pericardial cavity.  Echo 10/1 Study Conclusions - Left ventricle: Systolic function was mildly reduced. The   estimated ejection fraction was in the range of 45% to 50%. - Pericardium, extracardiac: A large pericardial effusion was   identified circumferential to the heart.  Patient Profile     84 y.o. male presented to [facility] with insomnia and was found to have pericardial effusion on CT. Large effusion verified on echo, now s/p pericardiocentesis on 10/1.  Assessment & Plan  1) Pericardial Effusion: Large effusion with tampanode physiology, now s/p pericardiocentesis. Draining yellow fluid with particulate matter; 80-100cc overnight. Etiology of effusion unclear. Chronic to some degree given history of effusion noted on CT in 2017. Thyroid disease may be contributing given chronicity and questionable disease control. Fluid cell count essentially normal, gram stain negative, no growth on culture. Fluid pH, LD, Protein and Glu pending. Chest tightness has resolved.  2) HTN: Improved today to 140s systolic. On home Norvasc.  3) Hypothyroidism: Out of meds for about 1 month, forgot to refill. TSH 33, free T4 0.5. Has resumed home synthroid.  For questions or updates, please contact [facility] Please consult [email or web address] for contact info under        Signed, Beola Cord, MD  05/08/2018, 11:17 AM    I have examined the patient and reviewed assessment and plan and discussed with patient.  Agree with above as stated.   Still with significant output from the drain. Receheck later today if output decreases.  Hopeful to be able to pull drain by tomorrow at the latest. Awaiting chemistries from the fluid.   Lance Muss

## 2018-05-08 NOTE — Progress Notes (Signed)
PROGRESS NOTE   Keith Nelson  ZOX:[id number]    DOB: 07-08-1930    DOA: 05/07/2018  PCP: [name], NP   I have briefly reviewed patients previous medical records in [facility]  Brief Narrative:  84 year old male, independent, brother lives with him, apparently works as a Hospital doctor, PMH of COPD, on as needed home oxygen at night, HLD, HTN, hypothyroid, chronic pericardial effusion known since 04/2016 managed conservatively, anemia, initially presented to [facility] with 1 month history of insomnia, anorexia, had stopped taking his thyroid medication a month ago and resumed recently, ongoing cough and progressive dyspnea since pneumonia in July this year and CT chest showed large pericardial effusion.  Patient was transferred to [facility], evaluated by cardiology and underwent pericardiocentesis of 1.1 L serous fluid 10/1.  Cardiology consulting.   Assessment & Plan:   Principal Problem:   Pericardial effusion Active Problems:   Essential hypertension   COPD (chronic obstructive pulmonary disease) (HCC)   Large pericardial effusion with early tamponade features by echo: Cardiology consulted and patient underwent successful pericardiocentesis of 1.1 L serous fluid 10/1.  Still has drain in place.  Clinically improved.  Follow-up on pericardial fluid results.  Etiology unclear.    Hypothyroid: Patient reportedly forgot to pick up his thyroid medications and did not take it for almost a month and resumed weekend prior to admission. Continue Synthroid and recheck TSH in 4 to 6 weeks. TSH 33.74 on 10/1  Essential hypertension: Mildly uncontrolled.  Continue amlodipine 5 mg daily.  Monitor and adjust as needed.  Chest tightness with cough: Not sure if this may have been related to his pericardial effusion.  Resolved at this time.  Monitor.  COPD: Stable.  Stage III chronic kidney disease: Baseline creatinine not known.  Follow BMP in a.m.   DVT prophylaxis: SCD's Code  Status: Full Family Communication: None at bedside Disposition: Pending clinical Improvement and cardiology clearance.   Consultants:  Cardiology   Procedures:  Pericardiocentesis 10/1  Antimicrobials:  None   Subjective: Feels much better and feels like he can go home. Denies cough or SOB. Min intermittent epigastric area pain.   ROS: As above  Objective:  Vitals:   05/08/18 0800 05/08/18 0900 05/08/18 0934 05/08/18 1000  BP: (!) 151/82 (!) 141/88 (!) 141/88 (!) 156/68  Pulse: 66 65  (!) 56  Resp: 18 (!) 26  15  Temp:      TempSrc:      SpO2: 90% 94%  91%  Weight:      Height:        Examination:  General exam: Pleasant elderly male, moderately built and nourished lying comfortably propped up in bed. Respiratory system: Clear to auscultation. Respiratory effort normal. Cardiovascular system: S1 & S2 heard, RRR. No JVD, murmurs, rubs, gallops or clicks. No pedal edema.Telemetry personally reviewed: SR-SB in the 55s.  No pericardial rub appreciated.  Pericardial drain in place. Gastrointestinal system: Abdomen is nondistended, soft and nontender. No organomegaly or masses felt. Normal bowel sounds heard. Central nervous system: Alert and oriented. No focal neurological deficits. Extremities: Symmetric 5 x 5 power. Skin: No rashes, lesions or ulcers Psychiatry: Judgement and insight appear normal. Mood & affect appropriate.     Data Reviewed: I have personally reviewed following labs and imaging studies  CBC: Recent Labs  Lab 05/08/18 0219  WBC 7.7  HGB 14.5  HCT 45.3  MCV 92.1  PLT 201   Basic Metabolic Panel: Recent Labs  Lab 05/08/18 0219  NA 133*  K 4.1  CL 94*  CO2 28  GLUCOSE 138*  BUN 14  CREATININE 1.36*  CALCIUM 9.1     Recent Results (from the past 240 hour(s))  Culture, body fluid-bottle     Status: None (Preliminary result)   Collection Time: 05/07/18  6:20 PM  Result Value Ref Range Status   Specimen Description FLUID  PERICARDIUM  Final   Special Requests BOTTLES DRAWN AEROBIC AND ANAEROBIC  Final   Culture   Final    NO GROWTH < 12 HOURS Performed at [facility], [address] [place], [place] [address]    Report Status PENDING  Incomplete  Gram stain     Status: None   Collection Time: 05/07/18  6:20 PM  Result Value Ref Range Status   Specimen Description FLUID PERICARDIUM  Final   Special Requests NONE  Final   Gram Stain   Final    WBC PRESENT,BOTH PMN AND MONONUCLEAR NO ORGANISMS SEEN CYTOSPIN SMEAR Performed at [facility], [address] [place], [place] [address]    Report Status 05/07/2018 FINAL  Final  MRSA PCR Screening     Status: None   Collection Time: 05/07/18  9:34 PM  Result Value Ref Range Status   MRSA by PCR NEGATIVE NEGATIVE Final    Comment:        The GeneXpert MRSA Assay (FDA approved for NASAL specimens only), is one component of a comprehensive MRSA colonization surveillance program. It is not intended to diagnose MRSA infection nor to guide or monitor treatment for MRSA infections. Performed at [facility], [address] [place], [place] [address]          Radiology Studies: No results found.      Scheduled Meds: . amLODipine  5 mg Oral Daily  . docusate sodium  100 mg Oral BID  . folic acid  1 mg Oral Daily  . levothyroxine  112 mcg Oral QAC breakfast  . mouth rinse  15 mL Mouth Rinse BID  . sodium chloride flush  3 mL Intravenous Q12H  . sodium chloride flush  3 mL Intravenous Q12H   Continuous Infusions: . sodium chloride    . sodium chloride    . lactated ringers 75 mL/hr at 05/07/18 1716     LOS: 1 day     Marcellus Scott, MD, [place], [facility]. [facility] Pager [phone]  If 7PM-7AM, please contact night-coverage [email or web address] Password [email or web address] 05/08/2018, 10:33 AM

## 2018-05-09 ENCOUNTER — Inpatient Hospital Stay (HOSPITAL_COMMUNITY): Payer: Medicare HMO

## 2018-05-09 DIAGNOSIS — I313 Pericardial effusion (noninflammatory): Secondary | ICD-10-CM

## 2018-05-09 DIAGNOSIS — I480 Paroxysmal atrial fibrillation: Secondary | ICD-10-CM

## 2018-05-09 LAB — BASIC METABOLIC PANEL
Anion gap: 6 (ref 5–15)
BUN: 16 mg/dL (ref 8–23)
CALCIUM: 8.3 mg/dL — AB (ref 8.9–10.3)
CHLORIDE: 100 mmol/L (ref 98–111)
CO2: 32 mmol/L (ref 22–32)
CREATININE: 1.39 mg/dL — AB (ref 0.61–1.24)
GFR calc Af Amer: 52 mL/min — ABNORMAL LOW (ref >60)
GFR calc non Af Amer: 45 mL/min — ABNORMAL LOW (ref >60)
Glucose, Bld: 88 mg/dL (ref 70–99)
Potassium: 3.8 mmol/L (ref 3.5–5.1)
Sodium: 138 mmol/L (ref 135–145)

## 2018-05-09 LAB — ECHOCARDIOGRAM LIMITED
HEIGHTINCHES: 68 in
WEIGHTICAEL: 2857.16 [oz_av]

## 2018-05-09 LAB — PATHOLOGIST SMEAR REVIEW

## 2018-05-09 MED ORDER — AMLODIPINE BESYLATE 10 MG PO TABS: 10.0000 mg | ORAL_TABLET | Freq: Every day | ORAL | 0 refills | Status: DC

## 2018-05-09 MED ORDER — AMLODIPINE BESYLATE 10 MG PO TABS: 10.0000 mg | ORAL_TABLET | Freq: Every day | ORAL | Status: DC

## 2018-05-09 MED ORDER — AMLODIPINE BESYLATE 5 MG PO TABS
5.0000 mg | ORAL_TABLET | Freq: Once | ORAL | Status: AC
  Administered 2018-05-09: 5 mg via ORAL
  Filled 2018-05-09: qty 1

## 2018-05-09 NOTE — Discharge Summary (Signed)
Physician Discharge Summary  Keith Nelson WUJ:[id number] DOB: 1929/12/11  PCP: Keith Severin, NP  Admit date: 05/07/2018 Discharge date: 05/09/2018  Recommendations for Outpatient Follow-up:  1. Keith Po, NP/PCP in 1 week with repeat labs (CBC & BMP). 2. Dr. [name], Cardiology on 05/15/2018 at 9:40 AM. 3. Recommend repeating TSH in 4 to 6 weeks.  Home Health: Patient is supposed to follow-up with [facility] cardiac rehab for pulmonary rehab. Equipment/Devices: Home oxygen at 4 L/min continuously.  Discharge Condition: Improved and stable CODE STATUS: Full Diet recommendation: Heart healthy diet.  Discharge Diagnoses:  Principal Problem:   Pericardial effusion Active Problems:   Essential hypertension   COPD (chronic obstructive pulmonary disease) (HCC)   Cardiac tamponade   Brief Summary: 84 year old male, independent, brother lives with him, apparently works as a Hospital doctor, PMH of COPD, on as needed home oxygen at night, HLD, HTN, hypothyroid, chronic pericardial effusion known since 04/2016 managed conservatively, anemia, initially presented to [facility] with 1 month history of insomnia, anorexia, had stopped taking his thyroid medication a month ago and resumed recently, ongoing cough and progressive dyspnea since pneumonia in July this year and CT chest showed large pericardial effusion.  Patient was transferred to [facility], evaluated by cardiology and underwent pericardiocentesis of 1.1 L serous fluid 10/1.  Cardiology consulted.   Assessment & Plan:   Large pericardial effusion with early tamponade features by echo: Cardiology consulted and patient underwent successful pericardiocentesis of 1.1 L serous fluid 10/1.  Drain came out overnight 10/2.  As per cardiology follow-up, the amount of drainage from the pericardial drain was decreasing prior to it coming out.  Etiology of effusion is unclear.  Recently untreated hypothyroid could  contribute but he had the effusion even before patient inadvertently stopped taking his Synthroid.  Pericardial fluid: pH 7.7, LDH 165, protein 6.9, glucose 101, WBC 235, neutrophils 33, lymphocytes 25, monocytes 42, Gram stain without organisms, culture negative after 2 days and cytopathology: Reactive mesothelial cells.  Patient underwent limited echo which shows that the pericardial effusion has decreased.  I discussed with Dr. Eldridge Dace, Cardiology who has cleared patient for discharge home and an outpatient follow-up has been arranged for next week.  Also recommend checking echo in a few weeks.  Hypothyroid: Patient reportedly forgot to pick up his thyroid medications and did not take it for almost a month and resumed weekend prior to admission. Continue Synthroid and recheck TSH in 4 to 6 weeks. TSH 33.74 on 10/1  Essential hypertension: Mildly uncontrolled.    Amlodipine was increased from 5mg  to 10 mg daily.  Outpatient follow-up.  Chest tightness with cough: Not sure if this may have been related to his pericardial effusion.    Resolved.  COPD: Stable without clinical bronchospasm..  Chronic respiratory failure with hypoxia: Patient reports that he was using oxygen at bedtime but unable to tell how many liters.  PT evaluated and patient now requires 4 L/min oxygen.  Hopefully some of his atelectasis should improve with pulmonary rehab.  This may help his hypoxia and decreased oxygen requirement.  Stage III chronic kidney disease: Baseline creatinine not known.    Creatinine stable in the 1.3 range.  Outpatient follow-up.  Intermittent atrial fibrillation: As per cardiology, patient had some transient A. fib noted on telemetry.  They indicate that patient is not a good candidate for anticoagulation given recent effusion and age.  A. fib may be related to thyroid issues and recent pericardial issues.  Recommend repeating  EKG as outpatient and consider monitor.   Consultants:  Cardiology    Procedures:  Pericardiocentesis 10/1   Discharge Instructions  Discharge Instructions    Call MD for:  difficulty breathing, headache or visual disturbances   Complete by:  As directed    Call MD for:  extreme fatigue   Complete by:  As directed    Call MD for:  persistant dizziness or light-headedness   Complete by:  As directed    Call MD for:  persistant nausea and vomiting   Complete by:  As directed    Call MD for:  severe uncontrolled pain   Complete by:  As directed    Call MD for:  temperature >100.4   Complete by:  As directed    Diet - low sodium heart healthy   Complete by:  As directed    Increase activity slowly   Complete by:  As directed        Medication List    TAKE these medications   albuterol (2.5 MG/3ML) 0.083% nebulizer solution Commonly known as:  PROVENTIL Take 3 mLs by nebulization every 6 (six) hours as needed for shortness of breath.   amLODipine 10 MG tablet Commonly known as:  NORVASC Take 1 tablet (10 mg total) by mouth daily. What changed:    medication strength  how much to take   folic acid 1 MG tablet Commonly known as:  FOLVITE Take 1 mg by mouth daily.   levothyroxine 112 MCG tablet Commonly known as:  SYNTHROID, LEVOTHROID Take 112 mcg by mouth daily before breakfast.   OXYGEN Inhale 1 Dose into the lungs at bedtime.      Follow-up Information    [facility] - Dme Follow up.   Why:  Continuous home oxygen @ 4 liters per minute Contact information: [address] [place] [address] [phone]        [facility] CARD[facility] Follow up.   Specialty:  Cardiac Rehabilitation Why:  Pulmonary rehab Contact information: [address] [id number] YQ [id number] [place] [address] [phone]       Garwin Brothers, MD. Go on 05/15/2018.   Specialty:  Cardiology Why:  @9 :40am for hospital follow up Contact information: [address] [place]  [address] [phone]        Keith Severin, NP. Schedule an appointment as soon as possible for a visit in 1 week(s).   Why:  To be seen with repeat labs (CBC & BMP). Contact information: [address] [place] [address] [phone]        Georgeanna Lea, MD .   Specialty:  Cardiology Contact information: [address] [place] [address] [place] [address] [phone]          No Known Allergies    Procedures/Studies: Dg Chest Port 1 View  Result Date: 05/09/2018 CLINICAL DATA:  84 year old male status post drainage of a pericardial effusion. Pericardial drain accidentally pulled out this morning. EXAM: PORTABLE CHEST 1 VIEW COMPARISON:  05/07/2018 and earlier.  Chest CTA 05/07/2018 FINDINGS: Portable AP semi upright view at 0620 hours. The cardiac silhouette appears smaller compared to 05/07/2018. Persistent residual dense retrocardiac opacity. Calcified aortic atherosclerosis. Other mediastinal contours are stable, suspected tortuous great vessels in the right paratracheal region. No pneumothorax. Stable pulmonary vascularity without acute edema. Small pleural effusions probably persist. No acute osseous abnormality identified. IMPRESSION: 1. Cardiac silhouette appears smaller than on 05/07/2018. Other mediastinal contours are stable.  2. Dense bilateral lower lobe lung collapse or consolidation. Probable small pleural effusions. 3.  Aortic Atherosclerosis (ICD10-I70.0). Electronically Signed   By: Odessa Fleming M.D.   On: 05/09/2018 08:39      Subjective: Patient denies complaints.  Anxious to return home.  Denies dyspnea, chest pain, palpitations, dizziness or lightheadedness.  Overnight events noted and pericardial drain fell out.  As per RN, no acute issues noted.  Discharge Exam:  Vitals:   05/09/18 1600 05/09/18 1603 05/09/18 1630 05/09/18 1700  BP: 130/78  (!) 143/74 (!) 148/68  Pulse: (!) 59  (!) 58 71  Resp: (!) 21  (!) 28 17  Temp:  98.2 F (36.8 C)    TempSrc:   Oral    SpO2: 95%  94% 92%  Weight:      Height:        General exam: Pleasant elderly male, moderately built and nourished lying comfortably propped up in bed. Respiratory system: Slightly diminished breath sounds in the bases but otherwise clear to auscultation. Respiratory effort normal. Cardiovascular system: S1 & S2 heard, RRR. No JVD, murmurs, rubs, gallops or clicks. No pedal edema.  Telemetry personally reviewed: Sinus rhythm with BBB morphology, occasional sinus bradycardia in the 50s.  Occasional PVCs. Gastrointestinal system: Abdomen is nondistended, soft and nontender. No organomegaly or masses felt. Normal bowel sounds heard. Central nervous system: Alert and oriented. No focal neurological deficits. Extremities: Symmetric 5 x 5 power. Skin: No rashes, lesions or ulcers Psychiatry: Judgement and insight appear normal. Mood & affect appropriate.     The results of significant diagnostics from this hospitalization (including imaging, microbiology, ancillary and laboratory) are listed below for reference.     Microbiology: Recent Results (from the past 240 hour(s))  Culture, body fluid-bottle     Status: None (Preliminary result)   Collection Time: 05/07/18  6:20 PM  Result Value Ref Range Status   Specimen Description FLUID PERICARDIUM  Final   Special Requests BOTTLES DRAWN AEROBIC AND ANAEROBIC  Final   Culture   Final    NO GROWTH 2 DAYS Performed at [facility], [address], [place], [place] [address]    Report Status PENDING  Incomplete  Gram stain     Status: None   Collection Time: 05/07/18  6:20 PM  Result Value Ref Range Status   Specimen Description FLUID PERICARDIUM  Final   Special Requests NONE  Final   Gram Stain   Final    WBC PRESENT,BOTH PMN AND MONONUCLEAR NO ORGANISMS SEEN CYTOSPIN SMEAR Performed at [facility], [address], [place], [place] [address]    Report Status 05/07/2018 FINAL  Final  MRSA PCR Screening      Status: None   Collection Time: 05/07/18  9:34 PM  Result Value Ref Range Status   MRSA by PCR NEGATIVE NEGATIVE Final    Comment:        The GeneXpert MRSA Assay (FDA approved for NASAL specimens only), is one component of a comprehensive MRSA colonization surveillance program. It is not intended to diagnose MRSA infection nor to guide or monitor treatment for MRSA infections. Performed at [facility], [address] [place], [place] [address]      Labs: CBC: Recent Labs  Lab 05/08/18 0219  WBC 7.7  HGB 14.5  HCT 45.3  MCV 92.1  PLT 201   Basic Metabolic Panel: Recent Labs  Lab 05/08/18 0219 05/09/18 0340  NA 133* 138  K 4.1  3.8  CL 94* 100  CO2 28 32  GLUCOSE 138* 88  BUN 14 16  CREATININE 1.36* 1.39*  CALCIUM 9.1 8.3*   Thyroid function studies Recent Labs    05/07/18 2149  TSH 33.739*   I discussed in detail with patient's brother, updated care and answered questions.   Time coordinating discharge: 40 minutes  SIGNED:  Marcellus Scott, MD, FACP, [facility]. [facility] Pager [phone]  If 7PM-7AM, please contact night-coverage [email or web address] Password [email or web address] 05/09/2018, 5:35 PM

## 2018-05-09 NOTE — Progress Notes (Signed)
2D Echocardiogram has been performed.  Pieter Partridge 05/09/2018, 3:27 PM

## 2018-05-09 NOTE — Progress Notes (Signed)
When RN assessed patient at midnight pericardial drain was in, blood/serous fluid was leaking around the dressing. Drsg reinforced and slight pressure applied. Pt on cpap, sleeping comfortably.

## 2018-05-09 NOTE — Discharge Instructions (Signed)

## 2018-05-09 NOTE — Evaluation (Signed)
Physical Therapy Evaluation Patient Details Name: Fedrick Cefalu MRN: [id number] DOB: 1929/11/30 Today's Date: 05/09/2018   History of Present Illness  84 yo admitted with large pericardial effusion s/p pericardiocentesis 10/1. PMHx: chronic pericardial effusion, HTN, HLD, COPD, hypothyroidism, anemia  Clinical Impression  Pt pleasant on arrival, reporting his independence without need for help. Arrived to pt on 2L with SpO2 at 92%, attempted room air with desat to 84% in sitting, during ambulation had to bump up to 4L with SpO2 ranging 89-91%, no recovery with cues for pursed lip breathing. Left on 4L in room, nurse aware. Pt still working and driving. Discussed with pt the need to wear O2 all the time at home right now, not just at night, as well as energy conservation techniques. Pt said able to take some time off prior to returning to work.  Pt able to ambulate min guard without AD, slight unsteadiness. Pt with decreased awareness of activity tolerance with need to quickly turn around and wheezing noted toward end of session. Pt with decreased balance, coordination (see PT problem list for all below). Pt will benefit from continued therapy to maximize functional mobility, independence and safety.     Follow Up Recommendations Other (comment)(pulmonary rehab)    Equipment Recommendations  None recommended by PT    Recommendations for Other Services       Precautions / Restrictions Precautions Precautions: None      Mobility  Bed Mobility Overal bed mobility: Modified Independent             General bed mobility comments: HOB flat  Transfers Overall transfer level: Modified independent               General transfer comment: decreased control of descent  Ambulation/Gait Ambulation/Gait assistance: Min guard Gait Distance (Feet): 350 Feet Assistive device: None Gait Pattern/deviations: Step-through pattern;Decreased stride length;Staggering right   Gait  velocity interpretation: >2.62 ft/sec, indicative of community ambulatory General Gait Details: pt required 4L of O2 to maintain SpO2 90% with activity, cues for direction and safety with fatigue limiting activity, two points with stagger to right and assist to restore balance. Pt unaware of fatigue and increased difficulty with breathing throughout session.   Stairs Stairs: Yes Stairs assistance: Modified independent (Device/Increase time) Stair Management: One rail Right;Alternating pattern;Forwards Number of Stairs: 3 General stair comments: steady with use of rail   Wheelchair Mobility    Modified Rankin (Stroke Patients Only)       Balance Overall balance assessment: Mild deficits observed, not formally tested                                           Pertinent Vitals/Pain Pain Assessment: No/denies pain    Home Living Family/patient expects to be discharged to:: Private residence Living Arrangements: Other relatives Available Help at Discharge: Family;Available 24 hours/day Type of Home: House Home Access: Stairs to enter Entrance Stairs-Rails: Left Entrance Stairs-Number of Steps: 3 Home Layout: One level Home Equipment: Walker - 2 wheels;Walker - 4 wheels;Cane - single point      Prior Function Level of Independence: Independent         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        Extremity/Trunk Assessment   Upper Extremity Assessment Upper Extremity Assessment: Overall WFL for tasks assessed    Lower Extremity Assessment Lower Extremity  Assessment: Overall WFL for tasks assessed    Cervical / Trunk Assessment Cervical / Trunk Assessment: Kyphotic  Communication   Communication: No difficulties  Cognition Arousal/Alertness: Awake/alert Behavior During Therapy: WFL for tasks assessed/performed Overall Cognitive Status: Impaired/Different from baseline Area of Impairment: Attention                    Current Attention Level: Selective           General Comments: tangential conversation with cues to return to question asked      General Comments      Exercises     Assessment/Plan    PT Assessment Patient needs continued PT services  PT Problem List Decreased mobility;Decreased safety awareness;Decreased activity tolerance;Decreased knowledge of use of DME;Cardiopulmonary status limiting activity;Decreased cognition       PT Treatment Interventions Gait training;Therapeutic activities;DME instruction;Functional mobility training;Balance training;Patient/family education    PT Goals (Current goals can be found in the Care Plan section)  Acute Rehab PT Goals Patient Stated Goal: get back to work PT Goal Formulation: With patient Time For Goal Achievement: 05/23/18 Potential to Achieve Goals: Good    Frequency Min 3X/week   Barriers to discharge        Co-evaluation               AM-PAC PT "6 Clicks" Daily Activity  Outcome Measure Difficulty turning over in bed (including adjusting bedclothes, sheets and blankets)?: None Difficulty moving from lying on back to sitting on the side of the bed? : None Difficulty sitting down on and standing up from a chair with arms (e.g., wheelchair, bedside commode, etc,.)?: A Little Help needed moving to and from a bed to chair (including a wheelchair)?: A Little Help needed walking in hospital room?: A Little Help needed climbing 3-5 steps with a railing? : None 6 Click Score: 21    End of Session Equipment Utilized During Treatment: Gait belt;Oxygen Activity Tolerance: Patient tolerated treatment well Patient left: in chair;with call bell/phone within reach;with chair alarm set;with family/visitor present Nurse Communication: Mobility status PT Visit Diagnosis: Other abnormalities of gait and mobility (R26.89);Muscle weakness (generalized) (M62.81)    Time: 1040-1105 PT Time Calculation (min) (ACUTE ONLY): 25  min   Charges:   PT Evaluation $PT Eval Moderate Complexity: 1 Mod          Carma Lair, [place]  Acute Rehab [phone]   Carma Lair 05/09/2018, 11:20 AM

## 2018-05-09 NOTE — Progress Notes (Signed)
RN went to get patient up for the morning at 0545. Patient was awake and wanted to take the Cpap off. Put patient on 2L Nasal canula(per pt, he wears o2 at home only at night). Patient wanted to get up and into the chair. RN went to change pts gown and found the pericardial drain lying on top of his stomach. No new drainage found from prior assessments. Patient was sating well on the nasal canula and his lung sounds were the same, still slightly diminished but clear. Pt stated he was still breathing well, and felt better than prior day. Cards fellow text- paged at [phone]. RN spoke with MD at [phone], portable CXR ordered. Cardiology will follow up this am. Patients vital signs stable.    Dorcas Carrow

## 2018-05-09 NOTE — Progress Notes (Signed)
Patient and his belongings discharged home with brother Clide Cliff. Discharge instructions explained to patient and brother. IVs taken out. VSS.

## 2018-05-09 NOTE — Progress Notes (Signed)
Patient's heart rhythm irregular this morning, seems to be normal sinus rhythm with PACs and PVCs per tele strip. While talking with patient about morning medicines, patient's HR jumped to 130s. Stat EKG ordered but patient's rate quickly came back down to 70s. EKG showed NSR with frequent PVCs and PACs. Alinda Money MD notified. No new orders at this time.

## 2018-05-09 NOTE — Progress Notes (Addendum)
Progress Note  Patient Name: Keith Nelson Date of Encounter: 05/09/2018  Primary Cardiologist: Gypsy Balsam, MD  Subjective   Patient feeling well today. States he's ready to go home and get back to work. He denies chest pain or shortness of breath. Pericardial drain came out overnight, output had been decreasing.  Inpatient Medications    Scheduled Meds: . [START ON 05/10/2018] amLODipine  10 mg Oral Daily  . amLODipine  5 mg Oral Once  . docusate sodium  100 mg Oral BID  . folic acid  1 mg Oral Daily  . levothyroxine  112 mcg Oral QAC breakfast  . mouth rinse  15 mL Mouth Rinse BID  . sodium chloride flush  3 mL Intravenous Q12H  . sodium chloride flush  3 mL Intravenous Q12H   Continuous Infusions: . sodium chloride    . sodium chloride     PRN Meds: sodium chloride, acetaminophen **OR** acetaminophen, albuterol, ondansetron **OR** ondansetron (ZOFRAN) IV, sodium chloride flush   Vital Signs    Vitals:   05/09/18 1030 05/09/18 1046 05/09/18 1051 05/09/18 1100  BP: (!) 153/69   (!) 148/70  Pulse: (!) 53 78  85  Resp: 15   20  Temp:      TempSrc:      SpO2: 92% 92% (!) 88% 93%  Weight:      Height:        Intake/Output Summary (Last 24 hours) at 05/09/2018 1130 Last data filed at 05/09/2018 1100 Gross per 24 hour  Intake 1801.6 ml  Output 950 ml  Net 851.6 ml   Filed Weights   05/07/18 1812  Weight: 81 kg    Telemetry    Predominantly NSR, minimal intermitted A.fib, PVCs and PACs - Personally Reviewed  ECG    NSR, PACs - Personally Reviewed  Physical Exam   GEN: No acute distress.   Neck: No JVD Cardiac: RRR, no murmurs, rubs, or gallops; Pericardial drain site clean and dry. Respiratory: Non-labored, Mild wheezes bilaterally GI: Soft, nontender, non-distended  MS: No edema; No deformity Neuro:  Nonfocal  Psych: Normal affect   Labs    Chemistry Recent Labs  Lab 05/08/18 0219 05/09/18 0340  NA 133* 138  K 4.1 3.8  CL 94*  100  CO2 28 32  GLUCOSE 138* 88  BUN 14 16  CREATININE 1.36* 1.39*  CALCIUM 9.1 8.3*  GFRNONAA 46* 45*  GFRAA 53* 52*  ANIONGAP 11 6     Hematology Recent Labs  Lab 05/08/18 0219  WBC 7.7  RBC 4.92  HGB 14.5  HCT 45.3  MCV 92.1  MCH 29.5  MCHC 32.0  RDW 13.7  PLT 201    Cardiac EnzymesNo results for input(s): TROPONINI in the last 168 hours. No results for input(s): TROPIPOC in the last 168 hours.   BNPNo results for input(s): BNP, PROBNP in the last 168 hours.   DDimer No results for input(s): DDIMER in the last 168 hours.   Radiology    Dg Chest Port 1 View  Result Date: 05/09/2018 CLINICAL DATA:  84 year old male status post drainage of a pericardial effusion. Pericardial drain accidentally pulled out this morning. EXAM: PORTABLE CHEST 1 VIEW COMPARISON:  05/07/2018 and earlier.  Chest CTA 05/07/2018 FINDINGS: Portable AP semi upright view at 0620 hours. The cardiac silhouette appears smaller compared to 05/07/2018. Persistent residual dense retrocardiac opacity. Calcified aortic atherosclerosis. Other mediastinal contours are stable, suspected tortuous great vessels in the right paratracheal region. No pneumothorax.  Stable pulmonary vascularity without acute edema. Small pleural effusions probably persist. No acute osseous abnormality identified. IMPRESSION: 1. Cardiac silhouette appears smaller than on 05/07/2018. Other mediastinal contours are stable. 2. Dense bilateral lower lobe lung collapse or consolidation. Probable small pleural effusions. 3.  Aortic Atherosclerosis (ICD10-I70.0). Electronically Signed   By: Odessa Fleming M.D.   On: 05/09/2018 08:39    Cardiac Studies   Pericardiocentesis 10/1 Pericardiocentesis performed by the subxiphoid approach with fluoro and ultrasound. 1100 mL of serous fluid was removed from the pericardial cavity.  Echo 10/1 Study Conclusions - Left ventricle: Systolic function was mildly reduced. The   estimated ejection fraction was in  the range of 45% to 50%. - Pericardium, extracardiac: A large pericardial effusion was   identified circumferential to the heart.  Repeat Limited Echo Today  Patient Profile     84 y.o. male presented to [facility] with insomnia and was found to have pericardial effusion on CT. Large effusion verified on echo, now s/p pericardiocentesis on 10/1. Drain came out overnight.  Assessment & Plan    1) Pericardial Effusion: Large effusion with tampanode physiology, now s/p pericardiocentesis. Draining yellow fluid with particulate matter; 15cc overnight prior to drain coming out, with decreased output, likely okay that drain is out. Etiology of effusion unclear. Chronic to some degree given history of effusion noted on CT in 2017. Thyroid disease may be contributing given chronicity and questionable disease control. Fluid cell count essentially normal, gram stain negative, no growth on culture. Fluid pH, LD, Protein and Glu without significant abonormality.  - Limited echo to re-evaluate effusion, if this okay, likely home today - Still awaiting cytology to rule out malignancy  2) HTN: Elevated to 140s-150s systolic. Increase home Norvasc to 10mg  Dialy.  3) Intermittent A.fib: Patient with a few brief episodes of A.fib on telemetry this AM, only one was brief tachycardia. This resolved spontaneously. May have been trigger by irritation due to cater placement and effusion drainage. Not a good candidate for anticoagulation wit recent procedure and age. Will hold off on anticoagulation for now and have patient follow up with primary cardiologist.  4) Hypothyroidism: Out of meds for about 1 month, forgot to refill. TSH 33, free T4 0.5. Has resumed home synthroid. Continue synthroid.  For questions or updates, please contact [facility] Please consult [email or web address] for contact info under       Signed, Beola Cord, MD  05/09/2018, 11:30 AM    I have examined the patient and reviewed  assessment and plan and discussed with patient.  Agree with above as stated.  INcrease amlodipine for BP.  DOing well.  Check limited echo.  Recheck echo in a few weeks.    Had some transient AFib noted on tele.  Not a good candidate for anticoagulation given recent effusion and age.  AFib may be related to thyroid issues and recent pericardial issues.  Repeat ECG as outpatient and consider monitor.  Thyroid may have been related to the effusion as well.  THis will need to be followed as an outpatient.   OK to discharge from a cardiac standpoint.   Lance Muss

## 2018-05-09 NOTE — Progress Notes (Signed)
SATURATION QUALIFICATIONS: (This note is used to comply with regulatory documentation for home oxygen)  Patient Saturations on Room Air at Rest = 84%  Patient Saturations on 2L at rest  = 92%  Patient Saturations on 4 Liters of oxygen while Ambulating = 91%  Please briefly explain why patient needs home oxygen: Pt desaturation at all times without supplemental oxygen Jody Aguinaga Abner Greenspan, PT Acute Rehabilitation Services Pager: [phone] Office: [phone]

## 2018-05-09 NOTE — Care Management Note (Addendum)
Case Management Note  Patient Details  Name: Jonanthony Nahar MRN: [id number] Date of Birth: [birth year]  Subjective/Objective:   84 yo male presented with a large pericardial effusion s/p pericardiocentesis 10/1. PMH: chronic pericardial effusion, HTN, HLD, COPD, hypothyroidism, anemia                Action/Plan: CM met with patient/brother to discuss transitional needs. Patient verbalized living at home with his brother, independent with ADLs, still working and driving PTA. Patient has a walker and SPC to assist with ambulation; home O2 for nocturnal use serviced by [facility]. CM discussed MD order for continuous home O2; PT recommendations for outpatient pulmonary rehab, with patient agreeable  Butch Penny [facility] liaison notified of continuous O2 order; AVS updated. Patient verbalized PCP as: Dr. Heide Scales; Pharmacy of choice: [facility] [place]. Patient's brother will provide transportation home and assistance post transition. CM will continue to follow.   Expected Discharge Date:                  Expected Discharge Plan:  Home/Self Care  In-House Referral:  NA  Discharge planning Services  CM Consult  Post Acute Care Choice:  Durable Medical Equipment Choice offered to:  Patient  DME Arranged:  Oxygen DME Agency:  [place] Arranged:  NA [place] Agency:  NA  Status of Service:  In process, will continue to follow  If discussed at Long Length of Stay Meetings, dates discussed:    Additional Comments: 05/09/18 @ 1450-Shaylynn Nulty RNCM-CM paged Dr. Algis Liming to discuss PT recommendations for outpatient pulmonary rehab. CB received from Dr. Algis Liming with a V.O. to enter referral; AVS updated. CM will continue to follow.   Midge Minium RN, BSN, NCM-BC, ACM-RN [phone] 05/09/2018, 12:52 PM

## 2018-05-12 LAB — CULTURE, BODY FLUID-BOTTLE: CULTURE: NO GROWTH

## 2018-05-12 LAB — CULTURE, BODY FLUID W GRAM STAIN -BOTTLE

## 2018-05-15 ENCOUNTER — Ambulatory Visit (INDEPENDENT_AMBULATORY_CARE_PROVIDER_SITE_OTHER): Payer: Medicare HMO | Admitting: Cardiology

## 2018-05-15 ENCOUNTER — Encounter: Payer: Self-pay | Admitting: Cardiology

## 2018-05-15 VITALS — BP 145/82 | HR 40 | Ht 68.0 in | Wt 193.0 lb

## 2018-05-15 DIAGNOSIS — J431 Panlobular emphysema: Secondary | ICD-10-CM | POA: Diagnosis not present

## 2018-05-15 DIAGNOSIS — I313 Pericardial effusion (noninflammatory): Secondary | ICD-10-CM | POA: Diagnosis not present

## 2018-05-15 DIAGNOSIS — I10 Essential (primary) hypertension: Secondary | ICD-10-CM

## 2018-05-15 DIAGNOSIS — I3139 Other pericardial effusion (noninflammatory): Secondary | ICD-10-CM

## 2018-05-15 NOTE — Patient Instructions (Signed)
Medication Instructions:  Your physician recommends that you continue on your current medications as directed. Please refer to the Current Medication list given to you today.  If you need a refill on your cardiac medications before your next appointment, please call your pharmacy.   Lab work: None If you have labs (blood work) drawn today and your tests are completely normal, you will receive your results only by: Marland Kitchen MyChart Message (if you have MyChart) OR . A paper copy in the mail If you have any lab test that is abnormal or we need to change your treatment, we will call you to review the results.  Testing/Procedures: None  Follow-Up: At [facility], you and your health needs are our priority.  As part of our continuing mission to provide you with exceptional heart care, we have created designated Provider Care Teams.  These Care Teams include your primary Cardiologist (physician) and Advanced Practice Providers (APPs -  Physician Assistants and Nurse Practitioners) who all work together to provide you with the care you need, when you need it. You will need a follow up appointment inmonths.  Please call our office 2 months in advance to schedule this appointment.  You may see Gypsy Balsam, MD or another member of our [facility] Provider Team in [place]: Gypsy Balsam, MD . Norman Herrlich, MD  Any Other Special Instructions Will Be Listed Below (If Applicable).
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
## 2018-06-04 DIAGNOSIS — Z7982 Long term (current) use of aspirin: Secondary | ICD-10-CM

## 2018-06-04 DIAGNOSIS — Z87891 Personal history of nicotine dependence: Secondary | ICD-10-CM

## 2018-06-04 DIAGNOSIS — Z789 Other specified health status: Secondary | ICD-10-CM | POA: Insufficient documentation

## 2018-06-04 DIAGNOSIS — I480 Paroxysmal atrial fibrillation: Secondary | ICD-10-CM

## 2018-06-04 HISTORY — DX: Long term (current) use of aspirin: Z79.82

## 2018-06-04 HISTORY — DX: Personal history of nicotine dependence: Z87.891

## 2018-06-04 HISTORY — DX: Paroxysmal atrial fibrillation: I48.0

## 2018-06-04 HISTORY — DX: Other specified health status: Z78.9

## 2018-06-06 ENCOUNTER — Inpatient Hospital Stay (HOSPITAL_COMMUNITY)
Admission: EM | Admit: 2018-06-06 | Discharge: 2018-06-10 | DRG: 271 | Disposition: A | Discharge status: Home / Self Care | Payer: Medicare HMO | Attending: Surgery | Admitting: Surgery

## 2018-06-06 ENCOUNTER — Other Ambulatory Visit: Payer: Self-pay

## 2018-06-06 ENCOUNTER — Encounter (HOSPITAL_COMMUNITY): Payer: Self-pay

## 2018-06-06 ENCOUNTER — Emergency Department (HOSPITAL_COMMUNITY): Payer: Medicare HMO

## 2018-06-06 DIAGNOSIS — Z87891 Personal history of nicotine dependence: Secondary | ICD-10-CM | POA: Diagnosis not present

## 2018-06-06 DIAGNOSIS — Z9981 Dependence on supplemental oxygen: Secondary | ICD-10-CM

## 2018-06-06 DIAGNOSIS — Z8249 Family history of ischemic heart disease and other diseases of the circulatory system: Secondary | ICD-10-CM | POA: Diagnosis not present

## 2018-06-06 DIAGNOSIS — Z961 Presence of intraocular lens: Secondary | ICD-10-CM | POA: Diagnosis present

## 2018-06-06 DIAGNOSIS — I313 Pericardial effusion (noninflammatory): Secondary | ICD-10-CM | POA: Diagnosis present

## 2018-06-06 DIAGNOSIS — Z972 Presence of dental prosthetic device (complete) (partial): Secondary | ICD-10-CM | POA: Diagnosis not present

## 2018-06-06 DIAGNOSIS — Z79890 Hormone replacement therapy (postmenopausal): Secondary | ICD-10-CM

## 2018-06-06 DIAGNOSIS — I310 Chronic adhesive pericarditis: Secondary | ICD-10-CM | POA: Diagnosis not present

## 2018-06-06 DIAGNOSIS — I339 Acute and subacute endocarditis, unspecified: Secondary | ICD-10-CM | POA: Diagnosis not present

## 2018-06-06 DIAGNOSIS — R3911 Hesitancy of micturition: Secondary | ICD-10-CM | POA: Diagnosis present

## 2018-06-06 DIAGNOSIS — J449 Chronic obstructive pulmonary disease, unspecified: Secondary | ICD-10-CM | POA: Diagnosis present

## 2018-06-06 DIAGNOSIS — Z79899 Other long term (current) drug therapy: Secondary | ICD-10-CM

## 2018-06-06 DIAGNOSIS — I129 Hypertensive chronic kidney disease with stage 1 through stage 4 chronic kidney disease, or unspecified chronic kidney disease: Secondary | ICD-10-CM | POA: Diagnosis present

## 2018-06-06 DIAGNOSIS — I77810 Thoracic aortic ectasia: Secondary | ICD-10-CM | POA: Diagnosis present

## 2018-06-06 DIAGNOSIS — N401 Enlarged prostate with lower urinary tract symptoms: Secondary | ICD-10-CM | POA: Diagnosis present

## 2018-06-06 DIAGNOSIS — I3139 Other pericardial effusion (noninflammatory): Secondary | ICD-10-CM

## 2018-06-06 DIAGNOSIS — I480 Paroxysmal atrial fibrillation: Secondary | ICD-10-CM | POA: Diagnosis present

## 2018-06-06 DIAGNOSIS — E039 Hypothyroidism, unspecified: Secondary | ICD-10-CM | POA: Diagnosis present

## 2018-06-06 DIAGNOSIS — N183 Chronic kidney disease, stage 3 (moderate): Secondary | ICD-10-CM | POA: Diagnosis present

## 2018-06-06 DIAGNOSIS — Z8701 Personal history of pneumonia (recurrent): Secondary | ICD-10-CM

## 2018-06-06 DIAGNOSIS — Z9842 Cataract extraction status, left eye: Secondary | ICD-10-CM

## 2018-06-06 DIAGNOSIS — J9611 Chronic respiratory failure with hypoxia: Secondary | ICD-10-CM | POA: Diagnosis present

## 2018-06-06 DIAGNOSIS — I700 Atherosclerosis of aorta: Secondary | ICD-10-CM | POA: Diagnosis present

## 2018-06-06 DIAGNOSIS — I314 Cardiac tamponade: Secondary | ICD-10-CM

## 2018-06-06 LAB — CBC WITH DIFFERENTIAL/PLATELET
Abs Immature Granulocytes: 0.02 10*3/uL (ref 0.00–0.07)
BASOS PCT: 1 %
Basophils Absolute: 0 10*3/uL (ref 0.0–0.1)
EOS ABS: 0.2 10*3/uL (ref 0.0–0.5)
Eosinophils Relative: 3 %
HCT: 44.1 % (ref 39.0–52.0)
Hemoglobin: 13.5 g/dL (ref 13.0–17.0)
Immature Granulocytes: 0 %
Lymphocytes Relative: 20 %
Lymphs Abs: 1.6 10*3/uL (ref 0.7–4.0)
MCH: 28.4 pg (ref 26.0–34.0)
MCHC: 30.6 g/dL (ref 30.0–36.0)
MCV: 92.6 fL (ref 80.0–100.0)
MONO ABS: 0.9 10*3/uL (ref 0.1–1.0)
Monocytes Relative: 11 %
NEUTROS PCT: 65 %
Neutro Abs: 5.2 10*3/uL (ref 1.7–7.7)
PLATELETS: 207 10*3/uL (ref 150–400)
RBC: 4.76 MIL/uL (ref 4.22–5.81)
RDW: 13.3 % (ref 11.5–15.5)
WBC: 8 10*3/uL (ref 4.0–10.5)
nRBC: 0 % (ref 0.0–0.2)

## 2018-06-06 LAB — BASIC METABOLIC PANEL
Anion gap: 9 (ref 5–15)
BUN: 19 mg/dL (ref 8–23)
CALCIUM: 9.3 mg/dL (ref 8.9–10.3)
CO2: 29 mmol/L (ref 22–32)
CREATININE: 1.42 mg/dL — AB (ref 0.61–1.24)
Chloride: 100 mmol/L (ref 98–111)
GFR calc Af Amer: 50 mL/min — ABNORMAL LOW (ref >60)
GFR, EST NON AFRICAN AMERICAN: 43 mL/min — AB (ref >60)
GLUCOSE: 97 mg/dL (ref 70–99)
Potassium: 3.5 mmol/L (ref 3.5–5.1)
Sodium: 138 mmol/L (ref 135–145)

## 2018-06-06 LAB — PROTIME-INR
INR: 1.08
Prothrombin Time: 13.9 seconds (ref 11.4–15.2)

## 2018-06-06 MED ORDER — LEVOTHYROXINE SODIUM 112 MCG PO TABS
112.0000 ug | ORAL_TABLET | Freq: Every day | ORAL | Status: DC
  Administered 2018-06-07 – 2018-06-10 (×4): 112 ug via ORAL
  Filled 2018-06-06 (×4): qty 1

## 2018-06-06 MED ORDER — FOLIC ACID 1 MG PO TABS
1.0000 mg | ORAL_TABLET | Freq: Every day | ORAL | Status: DC
  Administered 2018-06-07: 1 mg via ORAL
  Filled 2018-06-06: qty 1

## 2018-06-06 MED ORDER — SODIUM CHLORIDE 0.9 % IV SOLN: 250.0000 mL | INTRAVENOUS | Status: DC | PRN

## 2018-06-06 MED ORDER — SODIUM CHLORIDE 0.9% FLUSH: 3.0000 mL | INTRAVENOUS | Status: DC | PRN

## 2018-06-06 MED ORDER — ONDANSETRON HCL 4 MG/2ML IJ SOLN: 4.0000 mg | Freq: Four times a day (QID) | INTRAMUSCULAR | Status: DC | PRN

## 2018-06-06 MED ORDER — SODIUM CHLORIDE 0.9% FLUSH
3.0000 mL | Freq: Two times a day (BID) | INTRAVENOUS | Status: DC
  Administered 2018-06-06 – 2018-06-07 (×2): 3 mL via INTRAVENOUS

## 2018-06-06 MED ORDER — ACETAMINOPHEN 325 MG PO TABS: 650.0000 mg | ORAL_TABLET | ORAL | Status: DC | PRN

## 2018-06-06 NOTE — ED Notes (Signed)
Attempted report 
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
## 2018-06-06 NOTE — ED Notes (Signed)
Patient transported to X-ray 

## 2018-06-06 NOTE — ED Triage Notes (Signed)
Pt presents for pericardiocentesis, last performed 3 weeks ago.  Pt reports he went to [facility] in [place] and referred here due to buildup of fluid again.

## 2018-06-06 NOTE — Progress Notes (Signed)
Pt was transferred from ED to 4e03, VSS. Pt was able to ambulate to the bed, tolerated well. Obtained standing weight, CHG bath given, placed pt on tele, CCMD notified. Brother at bedside, pt is resting. Call bell within reach. Will continue to monitor.    Judithann Sheen, RN

## 2018-06-06 NOTE — ED Notes (Signed)
Lab confirmed they have blue top and will run specimen.
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
## 2018-06-07 ENCOUNTER — Inpatient Hospital Stay (HOSPITAL_COMMUNITY): Payer: Medicare HMO | Admitting: Anesthesiology

## 2018-06-07 ENCOUNTER — Inpatient Hospital Stay (HOSPITAL_COMMUNITY): Payer: Medicare HMO

## 2018-06-07 ENCOUNTER — Encounter (HOSPITAL_COMMUNITY)
Admission: EM | Disposition: A | Discharge status: Home / Self Care | Payer: Self-pay | Source: Home / Self Care | Attending: Surgery

## 2018-06-07 ENCOUNTER — Encounter (HOSPITAL_COMMUNITY): Payer: Self-pay | Admitting: Certified Registered Nurse Anesthetist

## 2018-06-07 DIAGNOSIS — I310 Chronic adhesive pericarditis: Secondary | ICD-10-CM

## 2018-06-07 DIAGNOSIS — I339 Acute and subacute endocarditis, unspecified: Secondary | ICD-10-CM

## 2018-06-07 DIAGNOSIS — I313 Pericardial effusion (noninflammatory): Secondary | ICD-10-CM

## 2018-06-07 DIAGNOSIS — I3139 Other pericardial effusion (noninflammatory): Secondary | ICD-10-CM

## 2018-06-07 HISTORY — PX: SUBXYPHOID PERICARDIAL WINDOW: SHX5075

## 2018-06-07 HISTORY — PX: TEE WITHOUT CARDIOVERSION: SHX5443

## 2018-06-07 LAB — BASIC METABOLIC PANEL
ANION GAP: 5 (ref 5–15)
BUN: 17 mg/dL (ref 8–23)
CALCIUM: 8.7 mg/dL — AB (ref 8.9–10.3)
CO2: 29 mmol/L (ref 22–32)
Chloride: 103 mmol/L (ref 98–111)
Creatinine, Ser: 1.29 mg/dL — ABNORMAL HIGH (ref 0.61–1.24)
GFR, EST AFRICAN AMERICAN: 57 mL/min — AB (ref >60)
GFR, EST NON AFRICAN AMERICAN: 49 mL/min — AB (ref >60)
Glucose, Bld: 89 mg/dL (ref 70–99)
Potassium: 3.3 mmol/L — ABNORMAL LOW (ref 3.5–5.1)
SODIUM: 137 mmol/L (ref 135–145)

## 2018-06-07 LAB — MRSA PCR SCREENING: MRSA by PCR: NEGATIVE

## 2018-06-07 LAB — C-REACTIVE PROTEIN: CRP: <0.8 mg/dL (ref <1.0)

## 2018-06-07 LAB — ECHOCARDIOGRAM LIMITED
Height: 68 in
WEIGHTICAEL: 2726.65 [oz_av]

## 2018-06-07 LAB — SEDIMENTATION RATE: SED RATE: 16 mm/h (ref 0–16)

## 2018-06-07 LAB — PREPARE RBC (CROSSMATCH)

## 2018-06-07 LAB — GLUCOSE, CAPILLARY: Glucose-Capillary: 84 mg/dL (ref 70–99)

## 2018-06-07 LAB — ABO/RH: ABO/RH(D): O NEG

## 2018-06-07 SURGERY — CREATION, PERICARDIAL WINDOW, SUBXIPHOID APPROACH
Anesthesia: General

## 2018-06-07 MED ORDER — OXYCODONE HCL 5 MG PO TABS: 5.0000 mg | ORAL_TABLET | ORAL | Status: DC | PRN

## 2018-06-07 MED ORDER — FENTANYL CITRATE (PF) 100 MCG/2ML IJ SOLN: 25.0000 ug | INTRAMUSCULAR | Status: DC | PRN

## 2018-06-07 MED ORDER — SUGAMMADEX SODIUM 200 MG/2ML IV SOLN
INTRAVENOUS | Status: DC | PRN
  Administered 2018-06-07: 309.2 mg via INTRAVENOUS

## 2018-06-07 MED ORDER — LIDOCAINE 2% (20 MG/ML) 5 ML SYRINGE
INTRAMUSCULAR | Status: AC
  Filled 2018-06-07: qty 5

## 2018-06-07 MED ORDER — SODIUM CHLORIDE 0.9 % IV SOLN
1.5000 g | Freq: Once | INTRAVENOUS | Status: AC
  Administered 2018-06-07: 1.5 g via INTRAVENOUS
  Filled 2018-06-07: qty 1.5

## 2018-06-07 MED ORDER — ONDANSETRON HCL 4 MG/2ML IJ SOLN: 4.0000 mg | Freq: Four times a day (QID) | INTRAMUSCULAR | Status: DC | PRN

## 2018-06-07 MED ORDER — 0.9 % SODIUM CHLORIDE (POUR BTL) OPTIME
TOPICAL | Status: DC | PRN
  Administered 2018-06-07: 2000 mL

## 2018-06-07 MED ORDER — ONDANSETRON HCL 4 MG/2ML IJ SOLN
INTRAMUSCULAR | Status: DC | PRN
  Administered 2018-06-07: 4 mg via INTRAVENOUS

## 2018-06-07 MED ORDER — LACTATED RINGERS IV SOLN
INTRAVENOUS | Status: DC
  Administered 2018-06-07: 13:00:00 via INTRAVENOUS

## 2018-06-07 MED ORDER — FENTANYL CITRATE (PF) 100 MCG/2ML IJ SOLN
INTRAMUSCULAR | Status: AC
  Administered 2018-06-07: 50 ug via INTRAVENOUS
  Filled 2018-06-07: qty 2

## 2018-06-07 MED ORDER — INSULIN ASPART 100 UNIT/ML ~~LOC~~ SOLN
0.0000 [IU] | SUBCUTANEOUS | Status: DC
  Administered 2018-06-08: 8 [IU] via SUBCUTANEOUS
  Administered 2018-06-08 (×2): 2 [IU] via SUBCUTANEOUS

## 2018-06-07 MED ORDER — ETOMIDATE 2 MG/ML IV SOLN
INTRAVENOUS | Status: AC
  Filled 2018-06-07: qty 10

## 2018-06-07 MED ORDER — SENNOSIDES-DOCUSATE SODIUM 8.6-50 MG PO TABS
1.0000 | ORAL_TABLET | Freq: Every day | ORAL | Status: DC
  Filled 2018-06-07: qty 1

## 2018-06-07 MED ORDER — ACETAMINOPHEN 500 MG PO TABS
1000.0000 mg | ORAL_TABLET | Freq: Four times a day (QID) | ORAL | Status: DC
  Administered 2018-06-07 – 2018-06-10 (×11): 1000 mg via ORAL
  Filled 2018-06-07 (×12): qty 2

## 2018-06-07 MED ORDER — CEFUROXIME SODIUM 1.5 G IV SOLR: 1.5000 g | INTRAVENOUS | Status: DC

## 2018-06-07 MED ORDER — LACTATED RINGERS IV SOLN
INTRAVENOUS | Status: DC | PRN
  Administered 2018-06-07: 13:00:00 via INTRAVENOUS

## 2018-06-07 MED ORDER — LUNG SURGERY BOOK
Freq: Once | Status: DC
  Filled 2018-06-07: qty 1

## 2018-06-07 MED ORDER — SUCCINYLCHOLINE CHLORIDE 200 MG/10ML IV SOSY
PREFILLED_SYRINGE | INTRAVENOUS | Status: AC
  Filled 2018-06-07: qty 10

## 2018-06-07 MED ORDER — ROCURONIUM BROMIDE 10 MG/ML (PF) SYRINGE
PREFILLED_SYRINGE | INTRAVENOUS | Status: DC | PRN
  Administered 2018-06-07: 50 mg via INTRAVENOUS
  Administered 2018-06-07: 20 mg via INTRAVENOUS

## 2018-06-07 MED ORDER — METOCLOPRAMIDE HCL 5 MG/ML IJ SOLN
10.0000 mg | Freq: Four times a day (QID) | INTRAMUSCULAR | Status: AC
  Administered 2018-06-07 – 2018-06-08 (×3): 10 mg via INTRAVENOUS
  Filled 2018-06-07 (×3): qty 2

## 2018-06-07 MED ORDER — FENTANYL CITRATE (PF) 100 MCG/2ML IJ SOLN
50.0000 ug | Freq: Once | INTRAMUSCULAR | Status: AC
  Administered 2018-06-07: 50 ug via INTRAVENOUS

## 2018-06-07 MED ORDER — FENTANYL CITRATE (PF) 250 MCG/5ML IJ SOLN
INTRAMUSCULAR | Status: DC | PRN
  Administered 2018-06-07: 100 ug via INTRAVENOUS
  Administered 2018-06-07: 50 ug via INTRAVENOUS

## 2018-06-07 MED ORDER — DEXAMETHASONE SODIUM PHOSPHATE 10 MG/ML IJ SOLN
INTRAMUSCULAR | Status: DC | PRN
  Administered 2018-06-07: 10 mg via INTRAVENOUS

## 2018-06-07 MED ORDER — ACETAMINOPHEN 160 MG/5ML PO SOLN: 1000.0000 mg | Freq: Four times a day (QID) | ORAL | Status: DC

## 2018-06-07 MED ORDER — MORPHINE SULFATE (PF) 2 MG/ML IV SOLN: 2.0000 mg | INTRAVENOUS | Status: DC | PRN

## 2018-06-07 MED ORDER — BISACODYL 5 MG PO TBEC
10.0000 mg | DELAYED_RELEASE_TABLET | Freq: Every day | ORAL | Status: DC
  Filled 2018-06-07 (×2): qty 2

## 2018-06-07 MED ORDER — PROPOFOL 10 MG/ML IV BOLUS
INTRAVENOUS | Status: DC | PRN
  Administered 2018-06-07: 20 mg via INTRAVENOUS

## 2018-06-07 MED ORDER — KCL IN DEXTROSE-NACL 20-5-0.9 MEQ/L-%-% IV SOLN
INTRAVENOUS | Status: DC
  Administered 2018-06-07 – 2018-06-08 (×2): via INTRAVENOUS
  Filled 2018-06-07 (×2): qty 1000

## 2018-06-07 MED ORDER — MIDAZOLAM HCL 2 MG/2ML IJ SOLN
INTRAMUSCULAR | Status: AC
  Filled 2018-06-07: qty 2

## 2018-06-07 MED ORDER — POTASSIUM CHLORIDE 10 MEQ/50ML IV SOLN: 10.0000 meq | Freq: Every day | INTRAVENOUS | Status: DC | PRN

## 2018-06-07 MED ORDER — TRAMADOL HCL 50 MG PO TABS: 50.0000 mg | ORAL_TABLET | Freq: Four times a day (QID) | ORAL | Status: DC | PRN

## 2018-06-07 MED ORDER — DEXAMETHASONE SODIUM PHOSPHATE 10 MG/ML IJ SOLN
INTRAMUSCULAR | Status: AC
  Filled 2018-06-07: qty 1

## 2018-06-07 MED ORDER — ROCURONIUM BROMIDE 50 MG/5ML IV SOSY
PREFILLED_SYRINGE | INTRAVENOUS | Status: AC
  Filled 2018-06-07: qty 5

## 2018-06-07 MED ORDER — ONDANSETRON HCL 4 MG/2ML IJ SOLN: 4.0000 mg | Freq: Once | INTRAMUSCULAR | Status: DC | PRN

## 2018-06-07 MED ORDER — ETOMIDATE 2 MG/ML IV SOLN
INTRAVENOUS | Status: DC | PRN
  Administered 2018-06-07: 12 mg via INTRAVENOUS

## 2018-06-07 MED ORDER — FENTANYL CITRATE (PF) 250 MCG/5ML IJ SOLN
INTRAMUSCULAR | Status: AC
  Filled 2018-06-07: qty 5

## 2018-06-07 MED ORDER — ONDANSETRON HCL 4 MG/2ML IJ SOLN
INTRAMUSCULAR | Status: AC
  Filled 2018-06-07: qty 2

## 2018-06-07 MED ORDER — SODIUM CHLORIDE 0.9 % IV SOLN
INTRAVENOUS | Status: DC | PRN
  Administered 2018-06-07: 40 ug/min via INTRAVENOUS

## 2018-06-07 MED ORDER — CEFAZOLIN SODIUM-DEXTROSE 2-4 GM/100ML-% IV SOLN
2.0000 g | Freq: Three times a day (TID) | INTRAVENOUS | Status: AC
  Administered 2018-06-07 – 2018-06-08 (×2): 2 g via INTRAVENOUS
  Filled 2018-06-07 (×2): qty 100

## 2018-06-07 MED ORDER — ENOXAPARIN SODIUM 40 MG/0.4ML ~~LOC~~ SOLN
40.0000 mg | Freq: Every day | SUBCUTANEOUS | Status: DC
  Administered 2018-06-08 – 2018-06-10 (×3): 40 mg via SUBCUTANEOUS
  Filled 2018-06-07 (×3): qty 0.4

## 2018-06-07 SURGICAL SUPPLY — 52 items
CANISTER SUCT 3000ML PPV (MISCELLANEOUS) ×2 IMPLANT
CATH THORACIC 28FR (CATHETERS) IMPLANT
CATH THORACIC 28FR RT ANG (CATHETERS) IMPLANT
CATH THORACIC 36FR (CATHETERS) IMPLANT
CATH THORACIC 36FR RT ANG (CATHETERS) IMPLANT
CLIP VESOCCLUDE MED 6/CT (CLIP) ×2 IMPLANT
CLIP VESOCCLUDE SM WIDE 6/CT (CLIP) ×2 IMPLANT
CONN ST 1/4X3/8  BEN (MISCELLANEOUS) ×1
CONN ST 1/4X3/8 BEN (MISCELLANEOUS) ×1 IMPLANT
CONT SPEC 4OZ CLIKSEAL STRL BL (MISCELLANEOUS) ×4 IMPLANT
COVER WAND RF STERILE (DRAPES) ×2 IMPLANT
DERMABOND ADHESIVE PROPEN (GAUZE/BANDAGES/DRESSINGS) ×1
DERMABOND ADVANCED .7 DNX6 (GAUZE/BANDAGES/DRESSINGS) ×1 IMPLANT
DRAIN CHANNEL 28F RND 3/8 FF (WOUND CARE) ×2 IMPLANT
DRAPE LAPAROSCOPIC ABDOMINAL (DRAPES) ×2 IMPLANT
DRAPE SLUSH/WARMER DISC (DRAPES) ×2 IMPLANT
ELECT BLADE 6.5 EXT (BLADE) ×2 IMPLANT
ELECT REM PT RETURN 9FT ADLT (ELECTROSURGICAL) ×2
ELECTRODE REM PT RTRN 9FT ADLT (ELECTROSURGICAL) ×1 IMPLANT
GAUZE SPONGE 4X4 12PLY STRL (GAUZE/BANDAGES/DRESSINGS) ×2 IMPLANT
GAUZE SPONGE 4X4 12PLY STRL LF (GAUZE/BANDAGES/DRESSINGS) ×2 IMPLANT
GLOVE EUDERMIC 7 POWDERFREE (GLOVE) ×2 IMPLANT
GOWN STRL REUS W/ TWL LRG LVL3 (GOWN DISPOSABLE) ×1 IMPLANT
GOWN STRL REUS W/ TWL XL LVL3 (GOWN DISPOSABLE) ×1 IMPLANT
GOWN STRL REUS W/TWL LRG LVL3 (GOWN DISPOSABLE) ×1
GOWN STRL REUS W/TWL XL LVL3 (GOWN DISPOSABLE) ×1
KIT BASIN OR (CUSTOM PROCEDURE TRAY) ×2 IMPLANT
KIT TURNOVER KIT B (KITS) ×2 IMPLANT
NS IRRIG 1000ML POUR BTL (IV SOLUTION) ×2 IMPLANT
PACK CHEST (CUSTOM PROCEDURE TRAY) ×2 IMPLANT
PAD ARMBOARD 7.5X6 YLW CONV (MISCELLANEOUS) ×4 IMPLANT
PAD DEFIB R2 (MISCELLANEOUS) ×2 IMPLANT
PAD ELECT DEFIB RADIOL ZOLL (MISCELLANEOUS) ×2 IMPLANT
SUT SILK  1 MH (SUTURE) ×1
SUT SILK 1 MH (SUTURE) ×1 IMPLANT
SUT VIC AB 0 CT1 18XCR BRD 8 (SUTURE) ×1 IMPLANT
SUT VIC AB 0 CT1 8-18 (SUTURE) ×1
SUT VIC AB 1 CTX 18 (SUTURE) IMPLANT
SUT VIC AB 2-0 CT1 27 (SUTURE) ×1
SUT VIC AB 2-0 CT1 TAPERPNT 27 (SUTURE) ×1 IMPLANT
SUT VIC AB 3-0 X1 27 (SUTURE) ×2 IMPLANT
SWAB COLLECTION DEVICE MRSA (MISCELLANEOUS) IMPLANT
SWAB CULTURE ESWAB REG 1ML (MISCELLANEOUS) IMPLANT
SYR 10ML LL (SYRINGE) IMPLANT
SYR 50ML SLIP (SYRINGE) IMPLANT
SYSTEM SAHARA CHEST DRAIN ATS (WOUND CARE) ×2 IMPLANT
TAPE CLOTH SURG 6X10 WHT LF (GAUZE/BANDAGES/DRESSINGS) ×2 IMPLANT
TOWEL GREEN STERILE (TOWEL DISPOSABLE) ×2 IMPLANT
TOWEL GREEN STERILE FF (TOWEL DISPOSABLE) ×2 IMPLANT
TRAP SPECIMEN MUCOUS 40CC (MISCELLANEOUS) ×4 IMPLANT
TRAY FOLEY SLVR 16FR TEMP STAT (SET/KITS/TRAYS/PACK) ×2 IMPLANT
WATER STERILE IRR 1000ML POUR (IV SOLUTION) ×4 IMPLANT

## 2018-06-07 NOTE — Op Note (Signed)
  CARDIOVASCULAR SURGERY OPERATIVE NOTE  06/07/2018  Surgeon:  Alleen Borne, MD  First Assistant: Jari Favre, PA-C   Preoperative Diagnosis:  Large recurrent pericardial effusion  Postoperative Diagnosis:  Same  Procedure:  Subxyphoid pericardial window   Anesthesia:  General Endotracheal   Clinical History/Surgical Indication:  This 84 year old gentleman has a recurrent large circumferential pericardial effusion with early indentation of the right ventricle but no clear signs of tamponade.  He has been hemodynamically stable and asymptomatic at home.  It was felt that the etiology of this was hypothyroidism.  I agree that subxiphoid pericardial window is indicated in this patient to completely drain the effusion and try to prevent recurrence.  I discussed the operative procedure with the patient and his brother.  I discussed alternatives, benefits, and risks including but not limited to bleeding, blood transfusion, infection, injury to the heart, recurrence of the pericardial effusion, and he understands and agrees to proceed.   Preparation:  The patient was seen in the preoperative holding area and the correct patient, correct operation were confirmed with the patient after reviewing the medical record and catheterization. The consent was signed by me. Preoperative antibiotics were given.  The patient was taken back to the operating room and positioned supine on the operating room table. After being placed under general endotracheal anesthesia by the anesthesia team a foley catheter was placed. The neck, chest, and abdomen were prepped with betadine soap and solution and draped in the usual sterile manner. A surgical time-out was taken and the correct patient and operative procedure were confirmed with the nursing and anesthesia staff.   TEE:  Performed by Dr. Bradley Ferris showed a large circumferential pericardial effusion.  Subxyphoid pericardial window:  A 4 cm incision was made  over the xyphoid process and carried down through the subcutaneous tissue using cautery until the midline fascia was encountered. The fascia was divided in the midline and the subxyphoid space entered. The anterior pericardium was visualized just at the diaphragm and it was opened with cautery. The pericardial space was entered and about 1000 cc of yellow serous fluid was removed. TEE confirmed that all of the fluid was removed. A 28 F Blake drain was placed in the inferior pericardial space through a separate small incision. Hemostasis was complete. The midline fascia was approximated with interrupted #0 vicryl sutures. The subcutaneous tissue was approximated with continuous 2-0 vicryl suture and the skin with 3-0 vicryl subcuticular suture. The sponge, needle, and instrument counts were correct according to the nurses. Dermabond was applied to the incision. The patient was awakened, extubated, and transported to the PACU in stable condition.

## 2018-06-07 NOTE — Anesthesia Postprocedure Evaluation (Signed)
Anesthesia Post Note  Patient: Keith Nelson  Procedure(s) Performed: SUBXYPHOID PERICARDIAL WINDOW (N/A ) TRANSESOPHAGEAL ECHOCARDIOGRAM (TEE) (N/A )     Patient location during evaluation: PACU Anesthesia Type: General Level of consciousness: awake Pain management: pain level controlled Vital Signs Assessment: post-procedure vital signs reviewed and stable Respiratory status: spontaneous breathing, nonlabored ventilation, respiratory function stable and patient connected to nasal cannula oxygen Cardiovascular status: blood pressure returned to baseline and stable Postop Assessment: no apparent nausea or vomiting Anesthetic complications: no    Last Vitals:  Vitals:   06/07/18 1700 06/07/18 1800  BP: 112/68 (!) 143/66  Pulse: 82 77  Resp: 16 15  Temp:    SpO2: 95% 95%    Last Pain:  Vitals:   06/07/18 1600  TempSrc:   PainSc: 0-No pain                 Law Corsino P Aneira Cavitt

## 2018-06-07 NOTE — Transfer of Care (Signed)
Immediate Anesthesia Transfer of Care Note  Patient: Keith Nelson  Procedure(s) Performed: SUBXYPHOID PERICARDIAL WINDOW (N/A ) TRANSESOPHAGEAL ECHOCARDIOGRAM (TEE) (N/A )  Patient Location: PACU  Anesthesia Type:General  Level of Consciousness: awake, alert , oriented and patient cooperative  Airway & Oxygen Therapy: Patient Spontanous Breathing and Patient connected to nasal cannula oxygen  Post-op Assessment: Report given to RN, Post -op Vital signs reviewed and stable and Patient moving all extremities X 4  Post vital signs: Reviewed and stable  Last Vitals:  Vitals Value Taken Time  BP 137/79 06/07/2018  2:46 PM  Temp 36.3 C 06/07/2018  2:45 PM  Pulse 87 06/07/2018  2:59 PM  Resp 18 06/07/2018  2:59 PM  SpO2 92 % 06/07/2018  2:59 PM  Vitals shown include unvalidated device data.  Last Pain:  Vitals:   06/07/18 1445  TempSrc:   PainSc: 0-No pain         Complications: No apparent anesthesia complications

## 2018-06-07 NOTE — Brief Op Note (Signed)
06/06/2018 - 06/07/2018  2:18 PM  PATIENT:  Laurena Spies  84 y.o. male  PRE-OPERATIVE DIAGNOSIS:  PERICARDIAL EFFUSION  POST-OPERATIVE DIAGNOSIS:  PERICARDIAL EFFUSION  PROCEDURE:  Procedure(s): SUBXYPHOID PERICARDIAL WINDOW (N/A) TRANSESOPHAGEAL ECHOCARDIOGRAM (TEE) (N/A)  SURGEON:  Surgeon(s) and Role:    * , Payton Doughty, MD - Primary  PHYSICIAN ASSISTANT: Jari Favre, PA-C   ANESTHESIA:   general  EBL:  minimal    BLOOD ADMINISTERED:none  DRAINS: (41F) Blake drain(s) in the pericardium   LOCAL MEDICATIONS USED:  NONE  SPECIMEN:  Source of Specimen:  pericardial fluid  DISPOSITION OF SPECIMEN:  PATHOLOGY  COUNTS:  YES  TOURNIQUET:  * No tourniquets in log *  DICTATION: .Note written in EPIC  PLAN OF CARE: Admit to inpatient   PATIENT DISPOSITION:  PACU - hemodynamically stable.   Delay start of Pharmacological VTE agent (>24hrs) due to surgical blood loss or risk of bleeding: yes

## 2018-06-07 NOTE — Anesthesia Procedure Notes (Signed)
Arterial Line Insertion Start/End11/08/2017 12:20 PM, 06/07/2018 12:25 PM Performed by: Adair Laundry, CRNA, CRNA  Patient location: Pre-op. Preanesthetic checklist: patient identified, IV checked, site marked, risks and benefits discussed, surgical consent, monitors and equipment checked, pre-op evaluation, timeout performed and anesthesia consent Lidocaine 1% used for infiltration and patient sedated radial was placed Catheter size: 20 G Hand hygiene performed  and maximum sterile barriers used  Allen's test indicative of satisfactory collateral circulation Attempts: 2 Procedure performed without using ultrasound guided technique. Following insertion, dressing applied and Biopatch. Post procedure assessment: normal  Patient tolerated the procedure well with no immediate complications.
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
## 2018-06-07 NOTE — Progress Notes (Signed)
  Echocardiogram Echocardiogram Transesophageal has been performed.  Keith Nelson 06/07/2018, 2:57 PM

## 2018-06-07 NOTE — Anesthesia Procedure Notes (Signed)
Central Venous Catheter Insertion Performed by: Leonides Grills, MD, anesthesiologist Start/End11/08/2017 12:50 PM, 06/07/2018 1:00 PM Patient location: Pre-op. Preanesthetic checklist: patient identified, IV checked, site marked, risks and benefits discussed, surgical consent, monitors and equipment checked, pre-op evaluation, timeout performed and anesthesia consent Lidocaine 1% used for infiltration and patient sedated Hand hygiene performed , maximum sterile barriers used  and Seldinger technique used Catheter size: 8 Fr Total catheter length 16. Central line was placed.Double lumen Procedure performed using ultrasound guided technique. Ultrasound Notes:anatomy identified, needle tip was noted to be adjacent to the nerve/plexus identified, no ultrasound evidence of intravascular and/or intraneural injection and image(s) printed for medical record Attempts: 1 Following insertion, dressing applied, line sutured and Biopatch. Post procedure assessment: blood return through all ports, free fluid flow and no air  Patient tolerated the procedure well with no immediate complications.

## 2018-06-07 NOTE — Progress Notes (Signed)
Patient ID: Keith Nelson, male   DOB: Jul 07, 1930, 84 y.o.   MRN: [id number]  TCTS Evening Rounds:   Hemodynamically stable    Urine output good  CT output low  A/P:  Stable postop course. Continue current plans.

## 2018-06-07 NOTE — Anesthesia Procedure Notes (Signed)
Procedure Name: Intubation Date/Time: 06/07/2018 1:24 PM Performed by: Julieta Bellini, CRNA Pre-anesthesia Checklist: Emergency Drugs available, Patient identified, Suction available and Patient being monitored Patient Re-evaluated:Patient Re-evaluated prior to induction Oxygen Delivery Method: Circle system utilized Preoxygenation: Pre-oxygenation with 100% oxygen Induction Type: IV induction Ventilation: Mask ventilation without difficulty and Oral airway inserted - appropriate to patient size Laryngoscope Size: Mac and 4 Grade View: Grade I Tube type: Oral Tube size: 7.5 mm Number of attempts: 1 Airway Equipment and Method: Stylet Placement Confirmation: ETT inserted through vocal cords under direct vision and positive ETCO2 Secured at: 23 cm Tube secured with: Tape Dental Injury: Teeth and Oropharynx as per pre-operative assessment

## 2018-06-07 NOTE — Anesthesia Preprocedure Evaluation (Addendum)
Anesthesia Evaluation  Patient identified by MRN, date of birth, ID band Patient awake    Reviewed: Allergy & Precautions, NPO status , Patient's Chart, lab work & pertinent test results  Airway Mallampati: II  TM Distance: >3 FB Neck ROM: Full    Dental  (+) Edentulous Lower, Edentulous Upper   Pulmonary COPD,  oxygen dependent, former smoker,    Pulmonary exam normal breath sounds clear to auscultation       Cardiovascular hypertension, Pt. on medications Normal cardiovascular exam Rhythm:Regular Rate:Normal  ECHO Left ventricle: The cavity size was normal. Wall thickness was   normal. Systolic function was low normal to mildly decreased. The   estimated ejection fraction was in the range of 50% to 55%.   Although no diagnostic regional wall motion abnormality was   identified, this possibility cannot be completely excluded on the   basis of this study. - Aortic valve: There was no stenosis. - Aorta: Mildly dilated aortic root. Aortic root dimension: 38 mm   (ED). - Mitral valve: There was trivial regurgitation. - Right ventricle: The cavity size was normal. Systolic function   was normal. - Pulmonary arteries: No complete TR doppler jet so unable to   estimate PA systolic pressure. - Systemic veins: IVC measured 2 cm with normal respirophasic   variation, suggesting normal right atrial pressure. - Pericardium, extracardiac: There is a large, circumferential   pericardial effusion. There is very slight early diastolic RV   indentation noted in parasternal long axis. There is right atrial   indentation. 23% respirophasic variation of mitral E inflow   velocity, not quite significant. The IVC is not dilated.    Neuro/Psych negative neurological ROS  negative psych ROS   GI/Hepatic negative GI ROS, Neg liver ROS,   Endo/Other  Hypothyroidism   Renal/GU Renal disease     Musculoskeletal negative musculoskeletal  ROS (+)   Abdominal   Peds  Hematology HLD   Anesthesia Other Findings PERICARDIAL EFFUSION  Reproductive/Obstetrics                            Anesthesia Physical Anesthesia Plan  ASA: IV  Anesthesia Plan: General   Post-op Pain Management:    Induction: Intravenous  PONV Risk Score and Plan: 2 and Ondansetron, Dexamethasone and Treatment may vary due to age or medical condition  Airway Management Planned: Oral ETT  Additional Equipment: Arterial line, CVP, Ultrasound Guidance Line Placement and TEE  Intra-op Plan:   Post-operative Plan: Extubation in OR and Possible Post-op intubation/ventilation  Informed Consent: I have reviewed the patients History and Physical, chart, labs and discussed the procedure including the risks, benefits and alternatives for the proposed anesthesia with the patient or authorized representative who has indicated his/her understanding and acceptance.   Dental advisory given  Plan Discussed with: CRNA  Anesthesia Plan Comments:        Anesthesia Quick Evaluation

## 2018-06-07 NOTE — Progress Notes (Signed)
  Echocardiogram 2D Echocardiogram has been performed.  Keith Nelson 06/07/2018, 9:23 AM

## 2018-06-08 ENCOUNTER — Encounter (HOSPITAL_COMMUNITY): Payer: Self-pay | Admitting: Surgery

## 2018-06-08 LAB — CBC
HEMATOCRIT: 39.9 % (ref 39.0–52.0)
HEMOGLOBIN: 12.8 g/dL — AB (ref 13.0–17.0)
MCH: 29.2 pg (ref 26.0–34.0)
MCHC: 32.1 g/dL (ref 30.0–36.0)
MCV: 91.1 fL (ref 80.0–100.0)
PLATELETS: 165 10*3/uL (ref 150–400)
RBC: 4.38 MIL/uL (ref 4.22–5.81)
RDW: 12.9 % (ref 11.5–15.5)
WBC: 12.4 10*3/uL — ABNORMAL HIGH (ref 4.0–10.5)
nRBC: 0 % (ref 0.0–0.2)

## 2018-06-08 LAB — BASIC METABOLIC PANEL
ANION GAP: 3 — AB (ref 5–15)
BUN: 20 mg/dL (ref 8–23)
CALCIUM: 8.4 mg/dL — AB (ref 8.9–10.3)
CO2: 27 mmol/L (ref 22–32)
CREATININE: 1.33 mg/dL — AB (ref 0.61–1.24)
Chloride: 105 mmol/L (ref 98–111)
GFR calc Af Amer: 55 mL/min — ABNORMAL LOW (ref >60)
GFR, EST NON AFRICAN AMERICAN: 47 mL/min — AB (ref >60)
Glucose, Bld: 151 mg/dL — ABNORMAL HIGH (ref 70–99)
Potassium: 3.9 mmol/L (ref 3.5–5.1)
Sodium: 135 mmol/L (ref 135–145)

## 2018-06-08 LAB — POCT I-STAT 3, ART BLOOD GAS (G3+)
ACID-BASE DEFICIT: 1 mmol/L (ref 0.0–2.0)
BICARBONATE: 26.3 mmol/L (ref 20.0–28.0)
O2 SAT: 94 %
PO2 ART: 77 mmHg — AB (ref 83.0–108.0)
Patient temperature: 97.1
TCO2: 28 mmol/L (ref 22–32)
pCO2 arterial: 50.2 mmHg — ABNORMAL HIGH (ref 32.0–48.0)
pH, Arterial: 7.323 — ABNORMAL LOW (ref 7.350–7.450)

## 2018-06-08 LAB — GLUCOSE, CAPILLARY
GLUCOSE-CAPILLARY: 148 mg/dL — AB (ref 70–99)
GLUCOSE-CAPILLARY: 157 mg/dL — AB (ref 70–99)
GLUCOSE-CAPILLARY: 137 mg/dL — AB (ref 70–99)
GLUCOSE-CAPILLARY: 105 mg/dL — AB (ref 70–99)
Glucose-Capillary: 209 mg/dL — ABNORMAL HIGH (ref 70–99)

## 2018-06-08 MED ORDER — INSULIN ASPART 100 UNIT/ML ~~LOC~~ SOLN
0.0000 [IU] | Freq: Three times a day (TID) | SUBCUTANEOUS | Status: DC
  Administered 2018-06-08 (×2): 2 [IU] via SUBCUTANEOUS

## 2018-06-08 MED ORDER — TAMSULOSIN HCL 0.4 MG PO CAPS
0.4000 mg | ORAL_CAPSULE | Freq: Every day | ORAL | Status: DC
  Administered 2018-06-08 – 2018-06-10 (×3): 0.4 mg via ORAL
  Filled 2018-06-08 (×3): qty 1

## 2018-06-08 NOTE — Progress Notes (Signed)
1 Day Post-Op Procedure(s) (LRB): SUBXYPHOID PERICARDIAL WINDOW (N/A) TRANSESOPHAGEAL ECHOCARDIOGRAM (TEE) (N/A) Subjective: Pain controlled. Breathing well. Sats 92% on RA and he has been on oxygen at home 24/7. Some difficulty passing urine since foley out. Has never has problems before but never had foley and felt like it was causing significant discomfort.   Objective: Vital signs in last 24 hours: Temp:  [97.1 F (36.2 C)-97.7 F (36.5 C)] 97.1 F (36.2 C) (11/02 0414) Pulse Rate:  [57-88] 63 (11/02 1100) Cardiac Rhythm: (P) Normal sinus rhythm (11/02 0800) Resp:  [12-23] 16 (11/02 1100) BP: (100-188)/(52-88) 130/61 (11/02 1100) SpO2:  [91 %-99 %] 92 % (11/02 1100) Arterial Line BP: (174-205)/(62-71) 174/65 (11/01 1900)  Hemodynamic parameters for last 24 hours:    Intake/Output from previous day: 11/01 0701 - 11/02 0700 In: 2718.2 [P.O.:240; I.V.:2278.2; IV Piggyback:200] Out: 2461 [Urine:1277; Blood:20; Chest Tube:164] Intake/Output this shift: Total I/O In: -  Out: 10 [Chest Tube:10]  General appearance: alert and cooperative Neurologic: intact Heart: regular rate and rhythm, S1, S2 normal, no murmur, click, rub or gallop Lungs: clear to auscultation bilaterally Extremities: extremities normal, atraumatic, no cyanosis or edema Wound: dressing dry  Lab Results: Recent Labs    06/06/18 1438 06/08/18 0523  WBC 8.0 12.4*  HGB 13.5 12.8*  HCT 44.1 39.9  PLT 207 165   BMET:  Recent Labs    06/07/18 0300 06/08/18 0358  NA 137 135  K 3.3* 3.9  CL 103 105  CO2 29 27  GLUCOSE 89 151*  BUN 17 20  CREATININE 1.29* 1.33*  CALCIUM 8.7* 8.4*    PT/INR:  Recent Labs    06/06/18 1438  LABPROT 13.9  INR 1.08   ABG    Component Value Date/Time   PHART 7.323 (L) 06/08/2018 0524   HCO3 26.3 06/08/2018 0524   TCO2 28 06/08/2018 0524   ACIDBASEDEF 1.0 06/08/2018 0524   O2SAT 94.0 06/08/2018 0524   CBG (last 3)  Recent Labs    06/08/18 0011  06/08/18 0403 06/08/18 0857  GLUCAP 209* 148* 157*    Assessment/Plan: S/P Procedure(s) (LRB): SUBXYPHOID PERICARDIAL WINDOW (N/A) TRANSESOPHAGEAL ECHOCARDIOGRAM (TEE) (N/A) POD 1 He has been hemodynamically stable in sinus rhythm.  Pericardial tube output low. Will keep in today.  Continue IS and ambulation  Will add Flomax for probable BPH with urinary hesitancy since foley removed.   LOS: 2 days    Alleen Borne 06/08/2018

## 2018-06-08 NOTE — Plan of Care (Signed)
  Problem: Clinical Measurements: Goal: Respiratory complications will improve Outcome: Progressing Goal: Cardiovascular complication will be avoided Outcome: Progressing   

## 2018-06-08 NOTE — Progress Notes (Signed)
Patient ID: Keith Nelson, male   DOB: 1930-03-07, 84 y.o.   MRN: [id number] T[facility] Evening Rounds:  Hemodynamically stable in sinus rhythm Urine output good Pericardial tube drainage minimal

## 2018-06-09 LAB — COMPREHENSIVE METABOLIC PANEL
ALBUMIN: 3.1 g/dL — AB (ref 3.5–5.0)
ALT: 13 U/L (ref 0–44)
AST: 26 U/L (ref 15–41)
Alkaline Phosphatase: 60 U/L (ref 38–126)
Anion gap: 4 — ABNORMAL LOW (ref 5–15)
BUN: 17 mg/dL (ref 8–23)
CHLORIDE: 105 mmol/L (ref 98–111)
CO2: 28 mmol/L (ref 22–32)
Calcium: 8.6 mg/dL — ABNORMAL LOW (ref 8.9–10.3)
Creatinine, Ser: 1.33 mg/dL — ABNORMAL HIGH (ref 0.61–1.24)
GFR calc Af Amer: 55 mL/min — ABNORMAL LOW (ref >60)
GFR, EST NON AFRICAN AMERICAN: 47 mL/min — AB (ref >60)
GLUCOSE: 100 mg/dL — AB (ref 70–99)
POTASSIUM: 3.9 mmol/L (ref 3.5–5.1)
SODIUM: 137 mmol/L (ref 135–145)
Total Bilirubin: 0.6 mg/dL (ref 0.3–1.2)
Total Protein: 6.2 g/dL — ABNORMAL LOW (ref 6.5–8.1)

## 2018-06-09 LAB — CBC
HCT: 39.7 % (ref 39.0–52.0)
Hemoglobin: 12.8 g/dL — ABNORMAL LOW (ref 13.0–17.0)
MCH: 29.2 pg (ref 26.0–34.0)
MCHC: 32.2 g/dL (ref 30.0–36.0)
MCV: 90.6 fL (ref 80.0–100.0)
NRBC: 0 % (ref 0.0–0.2)
PLATELETS: 164 10*3/uL (ref 150–400)
RBC: 4.38 MIL/uL (ref 4.22–5.81)
RDW: 13.3 % (ref 11.5–15.5)
WBC: 13.1 10*3/uL — ABNORMAL HIGH (ref 4.0–10.5)

## 2018-06-09 LAB — GLUCOSE, CAPILLARY: GLUCOSE-CAPILLARY: 94 mg/dL (ref 70–99)

## 2018-06-09 MED ORDER — METOPROLOL TARTRATE 5 MG/5ML IV SOLN
5.0000 mg | Freq: Once | INTRAVENOUS | Status: AC
  Administered 2018-06-09: 5 mg via INTRAVENOUS

## 2018-06-09 MED ORDER — METOPROLOL TARTRATE 5 MG/5ML IV SOLN
INTRAVENOUS | Status: AC
  Administered 2018-06-09: 5 mg via INTRAVENOUS
  Filled 2018-06-09: qty 5

## 2018-06-09 NOTE — Progress Notes (Signed)
Marchelle Folks RN aware of d/c CVC order

## 2018-06-09 NOTE — Progress Notes (Signed)
2 Days Post-Op Procedure(s) (LRB): SUBXYPHOID PERICARDIAL WINDOW (N/A) TRANSESOPHAGEAL ECHOCARDIOGRAM (TEE) (N/A) Subjective: No complaints  Objective: Vital signs in last 24 hours: Temp:  [97.6 F (36.4 C)-98.5 F (36.9 C)] 97.9 F (36.6 C) (11/03 0300) Pulse Rate:  [61-75] 73 (11/03 4098) Cardiac Rhythm: Normal sinus rhythm (11/03 0400) Resp:  [14-24] 14 (11/03 0633) BP: (121-151)/(57-83) 126/83 (11/03 1191) SpO2:  [90 %-100 %] 97 % (11/03 4782) Weight:  [76.9 kg] 76.9 kg (11/03 0600)  Hemodynamic parameters for last 24 hours:    Intake/Output from previous day: 11/02 0701 - 11/03 0700 In: 428.6 [I.V.:428.6] Out: 2013 [Urine:1925; Chest Tube:88] Intake/Output this shift: No intake/output data recorded.  General appearance: alert and cooperative Neurologic: intact Heart: regular rate and rhythm, S1, S2 normal, no murmur, click, rub or gallop Lungs: clear to auscultation bilaterally Wound: incision ok pericardial tube output decreasing, serous.  Lab Results: Recent Labs    06/08/18 0523 06/09/18 0600  WBC 12.4* 13.1*  HGB 12.8* 12.8*  HCT 39.9 39.7  PLT 165 164   BMET:  Recent Labs    06/08/18 0358 06/09/18 0600  NA 135 137  K 3.9 3.9  CL 105 105  CO2 27 28  GLUCOSE 151* 100*  BUN 20 17  CREATININE 1.33* 1.33*  CALCIUM 8.4* 8.6*    PT/INR:  Recent Labs    06/06/18 1438  LABPROT 13.9  INR 1.08   ABG    Component Value Date/Time   PHART 7.323 (L) 06/08/2018 0524   HCO3 26.3 06/08/2018 0524   TCO2 28 06/08/2018 0524   ACIDBASEDEF 1.0 06/08/2018 0524   O2SAT 94.0 06/08/2018 0524   CBG (last 3)  Recent Labs    06/08/18 1235 06/08/18 2130 06/09/18 0619  GLUCAP 137* 105* 94    Assessment/Plan: S/P Procedure(s) (LRB): SUBXYPHOID PERICARDIAL WINDOW (N/A) TRANSESOPHAGEAL ECHOCARDIOGRAM (TEE) (N/A)  POD 2 He is doing well, ambulating. Pericardial tube output is decreasing. Will keep tube in today and probably remove in am. He could go  home after that. Follow up on cytology. Suspect this is related to hypothyroidism and skipping meds for a month with markedly elevated TSH and low free T4. He is back on Synthroid now.   LOS: 3 days    Alleen Borne 06/09/2018

## 2018-06-09 NOTE — Progress Notes (Signed)
Patient ID: Keith Nelson, male   DOB: 1930-03-30, 84 y.o.   MRN: [id number] [facility] Evening Rounds:  Stable day Ambulated well Pericardial tube output remains low.

## 2018-06-10 LAB — GLUCOSE, CAPILLARY
GLUCOSE-CAPILLARY: 174 mg/dL — AB (ref 70–99)
GLUCOSE-CAPILLARY: 158 mg/dL — AB (ref 70–99)
GLUCOSE-CAPILLARY: 135 mg/dL — AB (ref 70–99)

## 2018-06-10 LAB — BPAM RBC
BLOOD PRODUCT EXPIRATION DATE: 201911272359
Blood Product Expiration Date: 201911082359
UNIT TYPE AND RH: 9500
Unit Type and Rh: 9500

## 2018-06-10 LAB — TYPE AND SCREEN
ABO/RH(D): O NEG
Antibody Screen: NEGATIVE
UNIT DIVISION: 0
Unit division: 0

## 2018-06-10 MED ORDER — TRAMADOL HCL 50 MG PO TABS: 50.0000 mg | ORAL_TABLET | Freq: Four times a day (QID) | ORAL | 0 refills | Status: DC | PRN

## 2018-06-10 MED ORDER — TAMSULOSIN HCL 0.4 MG PO CAPS: 0.4000 mg | ORAL_CAPSULE | Freq: Every day | ORAL | 0 refills | Status: DC

## 2018-06-10 MED ORDER — ACETAMINOPHEN 500 MG PO TABS: 1000.0000 mg | ORAL_TABLET | Freq: Four times a day (QID) | ORAL | 0 refills | Status: DC

## 2018-06-10 NOTE — Progress Notes (Signed)
Patient given discharge instructions and verbalizes understanding. Patient with no complaints at the current time. Will d/c via WC. 

## 2018-06-10 NOTE — Discharge Summary (Signed)
Physician Discharge Summary  Patient ID: Abdelrahman Nair MRN: [id number] DOB/AGE: 82-Dec-1931 82 y.o.  Admit date: 06/06/2018 Discharge date: 06/10/2018  Admission Diagnoses: Patient Active Problem List   Diagnosis Date Noted  . Pericardial effusion without cardiac tamponade   . Cardiac tamponade 05/08/2018  . Pericardial effusion 05/07/2018  . Essential hypertension 05/07/2018  . COPD (chronic obstructive pulmonary disease) (HCC) 05/07/2018  . Dyslipidemia 05/01/2016    Discharge Diagnoses:  Active Problems:   Pericardial effusion   Pericardial effusion without cardiac tamponade   Discharged Condition: good  HPI:   The patient is an 84 year old gentleman with history of hypertension, hypercholesterolemia, hypothyroidism, oxygen dependent COPD, stage II chronic kidney disease, who presented in early October with a large circumferential pericardial effusion with early signs of tamponade.  Patient had a pericardiocentesis performed by Dr. Excell Seltzer removing 1.1 L of serous fluid that was felt to be a transudate.  The cytology was negative for malignancy showing reactive mesothelial cells and macrophages.  It was felt that the most likely etiology was hypothyroidism since the patient had not taken his Synthroid for longer than a month and his TSH was 33 with a low free T4 at 0.54.  He has some paroxysmal atrial fibrillation during the hospitalization there was felt to be related to the catheter.  A follow-up echocardiogram after the pericardiocentesis showed resolution of the pericardial effusion.  Since going home he has been feeling well without chest pain or shortness of breath.  He was apparently seen by cardiology in [place] on 06/04/2018 and had a repeat echocardiogram that showed recurrence of the circumferential pericardial effusion.  Patient was transferred to [facility] for further care.  He says that he has felt fine without any symptoms.   Hospital Course:  Mr. Ziegler  underwent a subxiphoid pericardial window on 06/07/2018 with Dr. Laneta Simmers.  He tolerated the procedure well and was transferred to the surgical ICU.  He was extubated in a timely manner.  Postop day 1 his pain was well controlled.  He was on room air with good oxygen saturation.  We removed his Foley catheter.  He remained in normal sinus rhythm and hemodynamically stable.  His pericardial tube remains with low output, however we kept in place.  We added Flomax for probable BPH with urinary hesitancy since the Foley catheter was removed.  Postop day 3 we removed his chest tube.  He is pain is well controlled.  He was tolerating room air with excellent oxygen saturation.  He was ambulating around the unit with limited assistance.  His incisions are healing well and he is ready for discharge home.  He will follow-up with our office in 2 weeks for an appointment and suture removal.   Consults: None  Significant Diagnostic Studies: CLINICAL DATA:  Post pericardial window and drainage of pericardial effusion.  EXAM: PORTABLE CHEST 1 VIEW  COMPARISON:  Chest radiograph 06/06/2018, chest CT 05/27/2018  FINDINGS: Right internal jugular approach central venous catheter terminates within the expected location of the superior vena cava.  The cardiac silhouette is normal, post pericardial window placement. Calcific atherosclerotic disease and tortuosity of the aorta noted. Mediastinal contours appear intact.  There is no evidence of pneumothorax. Left lower lung field atelectasis versus airspace consolidation. Milder peribronchial airspace consolidation in the right lower lung field.  Osseous structures are without acute abnormality. Soft tissues are grossly normal.  IMPRESSION: The cardiac silhouette has returned to normal.  No evidence of pneumothorax or pneumomediastinum.  Bilateral lower lung fields  airspace consolidation versus atelectasis.   Electronically Signed   By:  Ted Mcalpine M.D.   On: 06/07/2018 15:41   Treatments:  CARDIOVASCULAR SURGERY OPERATIVE NOTE  06/07/2018  Surgeon:  Alleen Borne, MD  First Assistant: Jari Favre, PA-C   Preoperative Diagnosis:  Large recurrent pericardial effusion  Postoperative Diagnosis:  Same  Procedure:  Subxyphoid pericardial window   Anesthesia:  General Endotracheal   Clinical History/Surgical Indication:  This 84 year old gentleman has a recurrent large circumferential pericardial effusion with early indentation of the right ventricle but no clear signs of tamponade. He has been hemodynamically stable and asymptomatic at home. It was felt that the etiology of this was hypothyroidism.I agree that subxiphoid pericardial window is indicated in this patient to completely drain the effusion and try to prevent recurrence. I discussed the operative procedure with the patient and hisbrother. I discussed alternatives, benefits, and risks including but not limited to bleeding, blood transfusion, infection, injury to the heart, recurrence of the pericardial effusion, and he understands and agrees to proceed.   Discharge Exam: Blood pressure 135/79, pulse 70, temperature 97.6 F (36.4 C), temperature source Oral, resp. rate 15, height 5\' 8"  (1.727 m), weight 76.9 kg, SpO2 92 %.    General appearance: alert and cooperative Heart: regular rate and rhythm, S1, S2 normal, no murmur, click, rub or gallop Lungs: clear to auscultation bilaterally Wound: incision ok chest tube output low  Disposition: Discharge disposition: 01-Home or Self Care        Allergies as of 06/10/2018   No Known Allergies     Medication List    STOP taking these medications   amLODipine 10 MG tablet Commonly known as:  NORVASC     TAKE these medications   acetaminophen 500 MG tablet Commonly known as:  TYLENOL Take 2 tablets (1,000 mg total) by mouth every 6 (six) hours.   folic acid 1 MG  tablet Commonly known as:  FOLVITE Take 1 mg by mouth daily.   levothyroxine 112 MCG tablet Commonly known as:  SYNTHROID, LEVOTHROID Take 112 mcg by mouth daily before breakfast.   OXYGEN Inhale 1 Dose into the lungs at bedtime.   tamsulosin 0.4 MG Caps capsule Commonly known as:  FLOMAX Take 1 capsule (0.4 mg total) by mouth daily. Start taking on:  06/11/2018   traMADol 50 MG tablet Commonly known as:  ULTRAM Take 1 tablet (50 mg total) by mouth every 6 (six) hours as needed (mild pain).      Follow-up Information    Alleen Borne, MD. Go to.   Specialty:  Cardiothoracic Surgery Why:  Your routine follow-up appointment is on 06/24/2018 at 2:30pm. Please arrive at 2:00pm for a chest xray located at [facility] which is on the first floor of our building.  Contact information: [address] [place] [address] [phone]        [name], NP. Call in 1 day(s).   Contact information: [address] [place] [address] [phone]        Georgeanna Lea, MD .   Specialty:  Cardiology Contact information: [address] [place] [address] [phone]        Garwin Brothers, MD Follow up.   Specialty:  Cardiology Why:  Your appointment is on 08/15/2018 at 10:00am. Please bring your medication list Contact information: [address] [place] [address] [phone]  Signed: Sharlene Dory 06/10/2018, 12:05 PM

## 2018-06-10 NOTE — Discharge Instructions (Signed)
Pericardial Effusion °Pericardial effusion is a buildup of fluid around your heart. The heart is surrounded by a double-layered sac (pericardium). This sac normally contains a small amount of fluid. When too much builds up, it can put pressure on your heart and cause problems. As fluid builds up in the pericardial sac and pressure on your heart increases, it becomes harder for your heart to pump blood. When fluid prevents your heart from pumping enough blood, it is called cardiac tamponade. Cardiac tamponade is a life-threatening condition. °What are the causes? °Often the cause of pericardial effusion is not known (idiopathic effusion). Possible causes are from: °· Infections, such as from a virus, bacteria, fungus, or parasite. °· Damage to the pericardium from heart surgery or a heart attack. °· Inflammatory diseases, such as rheumatoid arthritis or lupus. °· Kidney disease. °· Thyroid disease. °· Cancer. °· Cancer treatment, including radiation or chemotherapy. °· Certain drugs, including tuberculosis drugs or seizure drugs. °· Chest trauma. ° °What are the signs or symptoms? °Pericardial effusion may not cause symptoms at first, especially if the fluid builds up slowly. In time, pressure on the heart may cause: °· Chest pain. °· Trouble breathing. °· Pain and shortness of breath that is worse when lying down. °· Dizziness. °· Fainting. °· Cough. °· Hiccups. °· Skipped heartbeats (palpitations). °· Anxiety and confusion. °· A bluish skin color (cyanosis). °· Swollen legs and ankles. ° °How is this diagnosed? °Your health care provider may suspect pericardial effusion based on your symptoms. Your health care provider may also do a physical exam to check for: °· Low blood pressure and weak pulses. °· Soft (muffled) heart sounds. °· Rapid heartbeat. °· Full veins in your neck (distended jugular veins). °· Decreased breathing sounds and a rubbing sound (friction rub) when listening to your lungs. ° °Your health care  provider may also do several tests to confirm the diagnosis and find out what is causing the pericardial effusion. These may include: °· Chest X-ray. °· Imaging study of the heart using sound waves (echocardiogram). °· CT scan or MRI. °· Electrical study of the heart (electrocardiogram [ECG]). °· A procedure using a needle to remove fluid from the pericardium for examination (pericardiocentesis). °· Blood tests to check for: °? Infection. °? Heart damage. °? Thyroid abnormalities. °? Kidney disease. °? Inflammatory disorders. ° °How is this treated? °Treatment for pericardial effusion depends on the cause of your symptoms and how severe your symptoms are. If a specific cause was found, that cause will be treated. Treatment may include: °· Medicines, such as: °? Nonsteroidal anti-inflammatory drugs (NSAIDs). °? Other anti-inflammatory drugs, such as steroids. °? Antibiotic medicine. °? Antifungal medicine. °· Hospital treatment may be necessary, such as for cardiac tamponade. This may include: °? Intravenous (IV) fluids. °? Breathing support. °· Surgery may be needed in severe cases. Surgery may include: °? Pericardiocentesis. °? Open heart surgery. °? A procedure to make a permanent opening in the pericardium (pericardial window). ° °Contact a health care provider if: °· You feel dizzy or light-headed. °· You have swelling in your legs or ankles. °· You have heart palpitations. °· You have persistent cough or hiccups. °Get help right away if: °· You faint. °· You have chest pain. °· You have trouble breathing. °These symptoms may represent a serious problem that is an emergency. Do not wait to see if the symptoms will go away. Get medical help right away. Call your local emergency services (911 in the [place] Do not drive yourself to   the hospital. °This information is not intended to replace advice given to you by your health care provider. Make sure you discuss any questions you have with your health care  provider. °Document Released: 03/21/2005 Document Revised: 12/30/2015 Document Reviewed: 12/11/2013 °Elsevier Interactive Patient Education © 2017 [employer] ° °

## 2018-06-10 NOTE — Progress Notes (Signed)
3 Days Post-Op Procedure(s) (LRB): SUBXYPHOID PERICARDIAL WINDOW (N/A) TRANSESOPHAGEAL ECHOCARDIOGRAM (TEE) (N/A) Subjective: No complaints, ambulating well.  Anxious to go home.  Objective: Vital signs in last 24 hours: Temp:  [97.5 F (36.4 C)-98.4 F (36.9 C)] 97.5 F (36.4 C) (11/04 0420) Pulse Rate:  [62-97] 71 (11/04 0700) Cardiac Rhythm: Normal sinus rhythm (11/04 0400) Resp:  [14-21] 21 (11/04 0700) BP: (110-147)/(60-77) 123/65 (11/04 0700) SpO2:  [91 %-100 %] 91 % (11/04 0700)  Hemodynamic parameters for last 24 hours:    Intake/Output from previous day: 11/03 0701 - 11/04 0700 In: 60 [P.O.:60] Out: 1245 [Urine:1125; Chest Tube:120] Intake/Output this shift: No intake/output data recorded.  General appearance: alert and cooperative Heart: regular rate and rhythm, S1, S2 normal, no murmur, click, rub or gallop Lungs: clear to auscultation bilaterally Wound: incision ok chest tube output low  Lab Results: Recent Labs    06/08/18 0523 06/09/18 0600  WBC 12.4* 13.1*  HGB 12.8* 12.8*  HCT 39.9 39.7  PLT 165 164   BMET:  Recent Labs    06/08/18 0358 06/09/18 0600  NA 135 137  K 3.9 3.9  CL 105 105  CO2 27 28  GLUCOSE 151* 100*  BUN 20 17  CREATININE 1.33* 1.33*  CALCIUM 8.4* 8.6*    PT/INR: No results for input(s): LABPROT, INR in the last 72 hours. ABG    Component Value Date/Time   PHART 7.323 (L) 06/08/2018 0524   HCO3 26.3 06/08/2018 0524   TCO2 28 06/08/2018 0524   ACIDBASEDEF 1.0 06/08/2018 0524   O2SAT 94.0 06/08/2018 0524   CBG (last 3)  Recent Labs    06/08/18 1235 06/08/18 2130 06/09/18 0619  GLUCAP 137* 105* 94    Assessment/Plan: S/P Procedure(s) (LRB): SUBXYPHOID PERICARDIAL WINDOW (N/A) TRANSESOPHAGEAL ECHOCARDIOGRAM (TEE) (N/A)  POD 3 Chest tube can come out and he can go home later today. He will need to be seen in the office in two weeks to check incision and remove suture. He will need to follow up with Dr.  Bing Matter in [facility] for cardiology care.   LOS: 4 days    Alleen Borne 06/10/2018

## 2018-06-11 ENCOUNTER — Other Ambulatory Visit: Payer: Self-pay

## 2018-06-11 ENCOUNTER — Telehealth: Payer: Self-pay

## 2018-06-11 MED ORDER — TAMSULOSIN HCL 0.4 MG PO CAPS: 0.4000 mg | ORAL_CAPSULE | Freq: Every day | ORAL | 0 refills | Status: AC

## 2018-06-11 NOTE — Telephone Encounter (Signed)
Keith Nelson, patient's brother called and stated that his Flomax was never sent to the pharmacy after being discharged from the hospital yesterday, 06/10/18.  He stated that he did not have a hard script either.  Advised him that the medication would be sent electronically to their specified pharmacy.  Did advised him that the patient would need to be seen by his PCP regarding this issue in the near future as Dr. Laneta Simmers would not continue to treat it if ongoing.  He acknowledged receipt.

## 2018-06-21 ENCOUNTER — Other Ambulatory Visit: Payer: Self-pay | Admitting: Surgery

## 2018-06-21 DIAGNOSIS — I313 Pericardial effusion (noninflammatory): Secondary | ICD-10-CM

## 2018-06-21 DIAGNOSIS — I3139 Other pericardial effusion (noninflammatory): Secondary | ICD-10-CM

## 2018-06-24 ENCOUNTER — Other Ambulatory Visit: Payer: Self-pay

## 2018-06-24 ENCOUNTER — Ambulatory Visit (INDEPENDENT_AMBULATORY_CARE_PROVIDER_SITE_OTHER): Payer: Self-pay | Admitting: Physician Assistant

## 2018-06-24 ENCOUNTER — Ambulatory Visit
Admission: RE | Admit: 2018-06-24 | Discharge: 2018-06-24 | Disposition: A | Discharge status: Home / Self Care | Payer: Medicare HMO | Source: Ambulatory Visit | Attending: Surgery | Admitting: Surgery

## 2018-06-24 VITALS — BP 168/80 | HR 71 | Resp 16 | Ht 68.0 in | Wt 180.0 lb

## 2018-06-24 DIAGNOSIS — I3139 Other pericardial effusion (noninflammatory): Secondary | ICD-10-CM

## 2018-06-24 DIAGNOSIS — I313 Pericardial effusion (noninflammatory): Secondary | ICD-10-CM

## 2018-06-24 DIAGNOSIS — Z09 Encounter for follow-up examination after completed treatment for conditions other than malignant neoplasm: Secondary | ICD-10-CM

## 2018-06-24 DIAGNOSIS — I314 Cardiac tamponade: Secondary | ICD-10-CM

## 2018-06-24 MED ORDER — AMLODIPINE BESYLATE 10 MG PO TABS: 10.0000 mg | ORAL_TABLET | Freq: Every day | ORAL | 0 refills | Status: DC

## 2018-06-24 NOTE — Progress Notes (Signed)
  HPI:  Patient returns for routine postoperative follow-up having undergone a subxiphoid pericardial window by Dr. Laneta SimmersBartle on 06/07/2018. The patient's early postoperative recovery while in the hospital was notable for urinary retention so he was started on Flomax. Since hospital discharge the patient reports, he is urinating without difficulty and he has no specific complaints.  Current Outpatient Medications  Medication Sig Dispense Refill  . acetaminophen (TYLENOL) 500 MG tablet Take 2 tablets (1,000 mg total) by mouth every 6 (six) hours. 30 tablet 0  . folic acid (FOLVITE) 1 MG tablet Take 1 mg by mouth daily.    Marland Kitchen. levothyroxine (SYNTHROID, LEVOTHROID) 112 MCG tablet Take 112 mcg by mouth daily before breakfast.     . OXYGEN Inhale 1 Dose into the lungs at bedtime.    . tamsulosin (FLOMAX) 0.4 MG CAPS capsule Take 1 capsule (0.4 mg total) by mouth daily. 30 capsule 0  . traMADol (ULTRAM) 50 MG tablet Take 1 tablet (50 mg total) by mouth every 6 (six) hours as needed (mild pain). 30 tablet 0  Vital Signs: BP 168/80, HR 71,  RR 16,  Oxygenation 91% on room air   Physical Exam: CV-RRR Pulmonary-Mostly clear Extremities-No LE edema Wound-Clean and dry  Diagnostic Tests: CLINICAL DATA:  Pericardial effusion, prior pericardiocentesis on 05/07/2018, subxiphoid pericardial window 06/07/2018, history COPD, hypertension, former smoker  EXAM: CHEST - 2 VIEW  COMPARISON:  06/07/2018  FINDINGS: Enlargement of cardiac silhouette.  Enlargement of RIGHT paratracheal soft tissues unchanged from prior exam, shown by prior CT chest to be due to a tortuous innominate artery.  Atherosclerotic calcification aorta.  Pulmonary vascularity normal.  Bibasilar atelectasis and minimal central peribronchial thickening.  No acute infiltrate, pleural effusion or pneumothorax.  Bones demineralized with old posttraumatic deformity of the proximal RIGHT humerus.  IMPRESSION: Enlargement  of cardiac silhouette.  Mild bronchitic changes with bibasilar atelectasis.   Electronically Signed   By: Ulyses SouthwardMark  Boles M.D.   On: 06/24/2018 13:40  Impression and Plan: Overall, Keith Nelson has recovered well from subxiphoid pericardial window. He is hypertensive on this visit. He states he has not been taking Amlodipine since being discharged from the hospital on 06/10/2018. I instructed him to start taking Amlodipine 10 mg daily tonight. He will return to see Dr. Laneta SimmersBartle PRN. He does have a follow up appointment to see Dr. Tomie Chinaevankar in January.  Keith Ballsonielle M Kenidi Elenbaas, PA-C [facility] [phone]

## 2018-07-08 ENCOUNTER — Other Ambulatory Visit: Payer: Self-pay | Admitting: Surgery

## 2018-08-04 ENCOUNTER — Other Ambulatory Visit: Payer: Self-pay | Admitting: Surgery

## 2018-08-15 ENCOUNTER — Ambulatory Visit: Payer: Medicare HMO | Admitting: Cardiology

## 2018-10-02 DIAGNOSIS — H2512 Age-related nuclear cataract, left eye: Secondary | ICD-10-CM

## 2018-10-02 DIAGNOSIS — H25042 Posterior subcapsular polar age-related cataract, left eye: Secondary | ICD-10-CM

## 2018-10-02 HISTORY — DX: Age-related nuclear cataract, left eye: H25.12

## 2018-10-02 HISTORY — DX: Posterior subcapsular polar age-related cataract, left eye: H25.042

## 2018-12-06 DIAGNOSIS — I480 Paroxysmal atrial fibrillation: Secondary | ICD-10-CM | POA: Insufficient documentation

## 2018-12-06 HISTORY — DX: Paroxysmal atrial fibrillation: I48.0

## 2019-10-26 DIAGNOSIS — R0902 Hypoxemia: Secondary | ICD-10-CM | POA: Diagnosis not present

## 2019-10-26 DIAGNOSIS — J181 Lobar pneumonia, unspecified organism: Secondary | ICD-10-CM | POA: Diagnosis not present

## 2019-10-26 DIAGNOSIS — I10 Essential (primary) hypertension: Secondary | ICD-10-CM

## 2019-10-26 DIAGNOSIS — J449 Chronic obstructive pulmonary disease, unspecified: Secondary | ICD-10-CM | POA: Diagnosis not present

## 2019-10-26 DIAGNOSIS — N183 Chronic kidney disease, stage 3 unspecified: Secondary | ICD-10-CM

## 2019-10-27 DIAGNOSIS — R0902 Hypoxemia: Secondary | ICD-10-CM | POA: Diagnosis not present

## 2019-10-27 DIAGNOSIS — J181 Lobar pneumonia, unspecified organism: Secondary | ICD-10-CM | POA: Diagnosis not present

## 2019-10-27 DIAGNOSIS — J449 Chronic obstructive pulmonary disease, unspecified: Secondary | ICD-10-CM | POA: Diagnosis not present

## 2019-10-27 DIAGNOSIS — N183 Chronic kidney disease, stage 3 unspecified: Secondary | ICD-10-CM | POA: Diagnosis not present

## 2019-10-28 DIAGNOSIS — J181 Lobar pneumonia, unspecified organism: Secondary | ICD-10-CM | POA: Diagnosis not present

## 2019-10-28 DIAGNOSIS — N183 Chronic kidney disease, stage 3 unspecified: Secondary | ICD-10-CM | POA: Diagnosis not present

## 2019-10-28 DIAGNOSIS — J449 Chronic obstructive pulmonary disease, unspecified: Secondary | ICD-10-CM | POA: Diagnosis not present

## 2019-10-28 DIAGNOSIS — R0902 Hypoxemia: Secondary | ICD-10-CM | POA: Diagnosis not present

## 2019-12-05 LAB — PROTIME-INR: INR: 1 (ref 0.9–1.1)

## 2019-12-06 ENCOUNTER — Encounter (HOSPITAL_COMMUNITY)
Admission: AD | Disposition: A | Discharge status: Home / Self Care | Payer: Self-pay | Source: Other Acute Inpatient Hospital | Attending: Cardiovascular Disease

## 2019-12-06 ENCOUNTER — Inpatient Hospital Stay (HOSPITAL_COMMUNITY)
Admission: AD | Admit: 2019-12-06 | Discharge: 2019-12-16 | DRG: 270 | Disposition: A | Discharge status: Home / Self Care | Payer: Medicare HMO | Source: Other Acute Inpatient Hospital | Attending: Cardiovascular Disease | Admitting: Cardiovascular Disease

## 2019-12-06 ENCOUNTER — Inpatient Hospital Stay (HOSPITAL_COMMUNITY): Payer: Medicare HMO

## 2019-12-06 DIAGNOSIS — I2510 Atherosclerotic heart disease of native coronary artery without angina pectoris: Secondary | ICD-10-CM | POA: Diagnosis present

## 2019-12-06 DIAGNOSIS — N401 Enlarged prostate with lower urinary tract symptoms: Secondary | ICD-10-CM | POA: Diagnosis present

## 2019-12-06 DIAGNOSIS — J449 Chronic obstructive pulmonary disease, unspecified: Secondary | ICD-10-CM | POA: Diagnosis present

## 2019-12-06 DIAGNOSIS — I5033 Acute on chronic diastolic (congestive) heart failure: Secondary | ICD-10-CM | POA: Diagnosis not present

## 2019-12-06 DIAGNOSIS — I3139 Other pericardial effusion (noninflammatory): Secondary | ICD-10-CM

## 2019-12-06 DIAGNOSIS — N1831 Chronic kidney disease, stage 3a: Secondary | ICD-10-CM | POA: Diagnosis present

## 2019-12-06 DIAGNOSIS — R338 Other retention of urine: Secondary | ICD-10-CM | POA: Diagnosis present

## 2019-12-06 DIAGNOSIS — N183 Chronic kidney disease, stage 3 unspecified: Secondary | ICD-10-CM | POA: Diagnosis not present

## 2019-12-06 DIAGNOSIS — I314 Cardiac tamponade: Secondary | ICD-10-CM | POA: Diagnosis present

## 2019-12-06 DIAGNOSIS — D649 Anemia, unspecified: Secondary | ICD-10-CM | POA: Diagnosis not present

## 2019-12-06 DIAGNOSIS — Z87891 Personal history of nicotine dependence: Secondary | ICD-10-CM

## 2019-12-06 DIAGNOSIS — N170 Acute kidney failure with tubular necrosis: Secondary | ICD-10-CM | POA: Diagnosis not present

## 2019-12-06 DIAGNOSIS — J9611 Chronic respiratory failure with hypoxia: Secondary | ICD-10-CM | POA: Diagnosis not present

## 2019-12-06 DIAGNOSIS — N179 Acute kidney failure, unspecified: Secondary | ICD-10-CM | POA: Diagnosis present

## 2019-12-06 DIAGNOSIS — Z961 Presence of intraocular lens: Secondary | ICD-10-CM | POA: Diagnosis present

## 2019-12-06 DIAGNOSIS — R0602 Shortness of breath: Secondary | ICD-10-CM

## 2019-12-06 DIAGNOSIS — I130 Hypertensive heart and chronic kidney disease with heart failure and stage 1 through stage 4 chronic kidney disease, or unspecified chronic kidney disease: Secondary | ICD-10-CM | POA: Diagnosis present

## 2019-12-06 DIAGNOSIS — I4891 Unspecified atrial fibrillation: Secondary | ICD-10-CM

## 2019-12-06 DIAGNOSIS — I10 Essential (primary) hypertension: Secondary | ICD-10-CM

## 2019-12-06 DIAGNOSIS — I480 Paroxysmal atrial fibrillation: Secondary | ICD-10-CM | POA: Diagnosis present

## 2019-12-06 DIAGNOSIS — J90 Pleural effusion, not elsewhere classified: Secondary | ICD-10-CM

## 2019-12-06 DIAGNOSIS — Z20822 Contact with and (suspected) exposure to covid-19: Secondary | ICD-10-CM | POA: Diagnosis present

## 2019-12-06 DIAGNOSIS — J42 Unspecified chronic bronchitis: Secondary | ICD-10-CM | POA: Diagnosis not present

## 2019-12-06 DIAGNOSIS — J9811 Atelectasis: Secondary | ICD-10-CM | POA: Diagnosis present

## 2019-12-06 DIAGNOSIS — Z8249 Family history of ischemic heart disease and other diseases of the circulatory system: Secondary | ICD-10-CM

## 2019-12-06 DIAGNOSIS — Z8701 Personal history of pneumonia (recurrent): Secondary | ICD-10-CM | POA: Diagnosis not present

## 2019-12-06 DIAGNOSIS — I4892 Unspecified atrial flutter: Secondary | ICD-10-CM | POA: Diagnosis not present

## 2019-12-06 DIAGNOSIS — E039 Hypothyroidism, unspecified: Secondary | ICD-10-CM | POA: Diagnosis present

## 2019-12-06 DIAGNOSIS — M1990 Unspecified osteoarthritis, unspecified site: Secondary | ICD-10-CM | POA: Diagnosis present

## 2019-12-06 DIAGNOSIS — J9621 Acute and chronic respiratory failure with hypoxia: Secondary | ICD-10-CM

## 2019-12-06 DIAGNOSIS — N1832 Chronic kidney disease, stage 3b: Secondary | ICD-10-CM | POA: Diagnosis not present

## 2019-12-06 DIAGNOSIS — Z09 Encounter for follow-up examination after completed treatment for conditions other than malignant neoplasm: Secondary | ICD-10-CM

## 2019-12-06 DIAGNOSIS — I959 Hypotension, unspecified: Secondary | ICD-10-CM | POA: Diagnosis present

## 2019-12-06 DIAGNOSIS — Z79890 Hormone replacement therapy (postmenopausal): Secondary | ICD-10-CM

## 2019-12-06 DIAGNOSIS — Z9981 Dependence on supplemental oxygen: Secondary | ICD-10-CM

## 2019-12-06 DIAGNOSIS — Z0184 Encounter for antibody response examination: Secondary | ICD-10-CM | POA: Diagnosis not present

## 2019-12-06 DIAGNOSIS — Z9841 Cataract extraction status, right eye: Secondary | ICD-10-CM | POA: Diagnosis not present

## 2019-12-06 DIAGNOSIS — R0902 Hypoxemia: Secondary | ICD-10-CM

## 2019-12-06 DIAGNOSIS — Z79899 Other long term (current) drug therapy: Secondary | ICD-10-CM

## 2019-12-06 DIAGNOSIS — I313 Pericardial effusion (noninflammatory): Secondary | ICD-10-CM

## 2019-12-06 DIAGNOSIS — R079 Chest pain, unspecified: Secondary | ICD-10-CM

## 2019-12-06 HISTORY — PX: PERICARDIOCENTESIS: CATH118255

## 2019-12-06 HISTORY — DX: Acute and chronic respiratory failure with hypoxia: J96.21

## 2019-12-06 LAB — ECHOCARDIOGRAM LIMITED

## 2019-12-06 LAB — SURGICAL PCR SCREEN
MRSA, PCR: NEGATIVE
Staphylococcus aureus: NEGATIVE

## 2019-12-06 SURGERY — PERICARDIOCENTESIS
Anesthesia: LOCAL

## 2019-12-06 MED ORDER — HEPARIN (PORCINE) IN NACL 1000-0.9 UT/500ML-% IV SOLN
INTRAVENOUS | Status: DC | PRN
  Administered 2019-12-06: 500 mL

## 2019-12-06 MED ORDER — MIDAZOLAM HCL 2 MG/2ML IJ SOLN
INTRAMUSCULAR | Status: AC
  Filled 2019-12-06: qty 2

## 2019-12-06 MED ORDER — HEPARIN (PORCINE) IN NACL 1000-0.9 UT/500ML-% IV SOLN
INTRAVENOUS | Status: AC
  Filled 2019-12-06: qty 500

## 2019-12-06 MED ORDER — LIDOCAINE HCL (PF) 1 % IJ SOLN
INTRAMUSCULAR | Status: DC | PRN
  Administered 2019-12-06: 15 mL

## 2019-12-06 MED ORDER — LIDOCAINE HCL (PF) 1 % IJ SOLN
INTRAMUSCULAR | Status: AC
  Filled 2019-12-06: qty 30

## 2019-12-06 MED ORDER — LACTATED RINGERS IV BOLUS
500.0000 mL | Freq: Once | INTRAVENOUS | Status: AC
  Administered 2019-12-06: 500 mL via INTRAVENOUS

## 2019-12-06 MED ORDER — ONDANSETRON HCL 4 MG/2ML IJ SOLN: 4.0000 mg | Freq: Four times a day (QID) | INTRAMUSCULAR | Status: DC | PRN

## 2019-12-06 MED ORDER — SODIUM CHLORIDE 0.9% FLUSH
10.0000 mL | Freq: Two times a day (BID) | INTRAVENOUS | Status: DC
  Administered 2019-12-07 (×2): 10 mL

## 2019-12-06 MED ORDER — ACETAMINOPHEN 325 MG PO TABS: 650.0000 mg | ORAL_TABLET | Freq: Four times a day (QID) | ORAL | Status: DC | PRN

## 2019-12-06 MED ORDER — ONDANSETRON HCL 4 MG PO TABS: 4.0000 mg | ORAL_TABLET | Freq: Four times a day (QID) | ORAL | Status: DC | PRN

## 2019-12-06 MED ORDER — CHLORHEXIDINE GLUCONATE CLOTH 2 % EX PADS
6.0000 | MEDICATED_PAD | Freq: Every day | CUTANEOUS | Status: DC
  Administered 2019-12-06 – 2019-12-07 (×2): 6 via TOPICAL

## 2019-12-06 MED ORDER — ORAL CARE MOUTH RINSE
15.0000 mL | Freq: Two times a day (BID) | OROMUCOSAL | Status: DC
  Administered 2019-12-07 (×2): 15 mL via OROMUCOSAL

## 2019-12-06 MED ORDER — CHLORHEXIDINE GLUCONATE 0.12 % MT SOLN
15.0000 mL | Freq: Two times a day (BID) | OROMUCOSAL | Status: DC
  Administered 2019-12-06 – 2019-12-08 (×4): 15 mL via OROMUCOSAL
  Filled 2019-12-06 (×3): qty 15

## 2019-12-06 MED ORDER — MIDAZOLAM HCL 2 MG/2ML IJ SOLN
INTRAMUSCULAR | Status: DC | PRN
  Administered 2019-12-06: 0.5 mg via INTRAVENOUS

## 2019-12-06 MED ORDER — MUPIROCIN 2 % EX OINT: 1.0000 "application " | TOPICAL_OINTMENT | Freq: Two times a day (BID) | CUTANEOUS | Status: DC

## 2019-12-06 MED ORDER — ACETAMINOPHEN 650 MG RE SUPP: 650.0000 mg | Freq: Four times a day (QID) | RECTAL | Status: DC | PRN

## 2019-12-06 SURGICAL SUPPLY — 3 items
ELECT DEFIB PAD ADLT CADENCE (PAD) ×2 IMPLANT
PACK CARDIAC CATHETERIZATION (CUSTOM PROCEDURE TRAY) ×2 IMPLANT
PERIVAC PERICARDIOCENTESIS 8.3 (TRAY / TRAY PROCEDURE) ×2 IMPLANT

## 2019-12-06 NOTE — Progress Notes (Signed)
Received to [phone] via ambulance stretcher with [facility]. States midsternal CP 1-2/10 unchanged. Cardiac Monitor verified x 2 with CCMD. HR = 130's to 140's in Afib. Oxygen sats 90 - 92% at rest on 5 Liters via [place]. RR =  Mews is yellow. Hospitalist pager called and notified of pt's arrival and to notify of 'yellow' mews. On call to be notified. Oriented to room/unit. Call bell and telephone in reach. Encouraged to call for needs and oob etc. Verbalized understanding.
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
## 2019-12-06 NOTE — Significant Event (Signed)
Rapid Response Event Note  Overview: Assisted with expediting tx to 2H for pericardial drain placement. Pt placed on NRB prior to transfer d/t SpO2-89% on 6L [place]. 500cc LR bolus also hung prior to transfer by bedside RN per MD order. Pt transferred to [facility] without complication. Please call RRT if further assistance needed.     Terrilyn Saver

## 2019-12-06 NOTE — Interval H&P Note (Signed)
History and Physical Interval Note:  12/06/2019 9:42 PM  Keith Nelson  has presented today for surgery, with the diagnosis of STEMI.  The various methods of treatment have been discussed with the patient and family. After consideration of risks, benefits and other options for treatment, the patient has consented to  Procedure(s): PERICARDIOCENTESIS (N/A) as a surgical intervention.  The patient's history has been reviewed, patient examined, no change in status, stable for surgery.  I have reviewed the patient's chart and labs.  Questions were answered to the patient's satisfaction.     Nicki Guadalajara

## 2019-12-06 NOTE — Progress Notes (Signed)
  Echocardiogram 2D Echocardiogram has been performed.  Keith Nelson 12/06/2019, 10:48 PM

## 2019-12-06 NOTE — Progress Notes (Signed)
CCMD notified of HR in 160s. Cardiology was on the floor at the time and spoke with this RN about the possibility of transferring patient to 2H for closer observation. Rapid response was on the floor, assessed patient and agreed with transfer. Report called to 2H and patient transferred with rapid response and charge nurse.  Adair Patter, RN

## 2019-12-06 NOTE — Progress Notes (Addendum)
CARDIOLOGY TRANSFER NOTE  HPI  Keith Nelson was transferred to the cardiac ICU this evening from the hospitalist service for ongoing management of cardiac tamponade.  He is an 86 year old gentleman with a history of hypertension, hypercholesterolemia, hypothyroidism, oxygen dependent COPD, and BPH who previously had pericardial window for transudative pericardial effusion.  He had initially presented with early signs of tamponade and had 1.1 L of serous fluid removed.  Cytology was negative for malignancy but showed reactive mesothelial cells and macrophages.  Etiology was felt most likely to be hypothyroidism since TSH was 33 and he had been off previously prescribed Synthroid.  That hospital course was complicated by paroxysmal atrial fibrillation while pericardial drain was in place.  Repeat echocardiogram at the end of October showed recurrence of circumferential pericardial effusion, and he subsequently underwent subxiphoid pericardial window on 06/07/2018 with Dr. Cyndia Bent.  Postoperative course was uncomplicated, but surveillance echo in December 2019 demonstrated early recurrence of moderate pericardial effusion.  As this was not hemodynamically significant, there is no intervention.  Keith Nelson now returns with several months of early satiety and abdominal distention followed by several weeks of chest discomfort and dyspnea on exertion.  Symptoms acutely worsened this morning and he presented to the [facility] ED.  On arrival, heart rate was 84, blood pressure was 120s over 60s, and he required 2 L nasal cannula (which he wears at home).  CTA of the chest obtained in evaluation for PE demonstrated large pericardial effusion, but no PE.  Echo obtained at [facility] demonstrated relatively normal LVEF but was concerning for early RV diastolic collapse.  He was transferred to [facility] for further evaluation.  Hospital Course  On arrival, Keith Nelson was in A. fib with RVR with heart rates in the 140s to 160s.   Blood pressure was 90s over 60s and he was requiring 6 L oxygen to maintain SPO2 around 90%.  Pulsus paradoxus was present via a manual blood pressure cuff with a differential of 25 mmHg.  Emergent echo was obtained which demonstrated circumferential pericardial effusion with both fluid and soft tissue components, underfilled RV and LV with ventricular interdependence (septal bowing with respiratory variation), RV diastolic collapse, respiratory variation of flow greater than 25% across the mitral and tricuspid valves, and dilated IVC.  He was given 500 mL of IV fluid and taken emergently to the cardiac Cath Lab for emergency pericardiocentesis.   In the Cath Lab, pericardial drain was placed under ultrasound guidance and 850 mL of dark yellow serous fluid was removed.  A pericardial drain was left in place to accordion suction.  After fluid removal, heart rate trended down to the 120s in A. fib and blood pressure improved to 120s over 60s.  He reports improvement in symptoms.  Assessment  Mr. Fauth has now had a recurrence of hemodynamically significant pericardial effusion despite the presence of a pericardial window.  He has residual soft tissue density material in pericardium suggestive of chronic fibrotic or inflammatory material (less likely thrombus given that fluid removed was not in any way sanguinous).  These findings suggest ongoing stimulus for pericardial scarring to obstruct prior window and promote fluid reaccumulation, including but not limited to recurrent hypothyroidism, occult inflammatory disease, occult malignancy, bacterial superinfection.  We have sent his pericardial fluid sample for serology and cytology and will use these findings to guide further evaluation.  He will also need evaluation for effusive constrictive disease given the non-fluid material left in his pericardium.  His clinical status is improving  after drainage, but if he remains somewhat symptomatic, he would benefit from  right and left heart catheterization to evaluate for pericardial constriction.  Now the tamponade physiology has been corrected, we will also work on rate/rhythm control for atrial fibrillation.  Plan  Recurrent pericardial effusion with tamponade -Flush drain every 12 hours, keep to suction currently -Fluid sent for cell count culture, protein, LDH, cytology  Update: 39K nucleated cells, MRSA negative. Will start Zosyn and follow cultures  -Check serum ESR, CRP, LDH -We will follow up TSH and adjust Synthroid accordingly -If exudative effusion, will follow culture and would recommend getting CT scan of the chest, abdomen, and pelvis to evaluate for occult malignancy, given progressive early satiety with abdominal distention. -If symptoms persist, will pursue right and left heart cath for evaluation of constriction -CT surgery consult to discuss whether repeat window is warranted  Atrial fibrillation with RVR -If RVR persists after he recovers from pericardial drain procedure, will start gentle rate control with low-dose metoprolol -CHA2DS2-VASc score is 3, so he should be counseled regarding anticoagulation for stroke prevention after pericardial drain is removed  Essential hypertension -Hold amlodipine for now, will resume pending recovery of tamponade physiology  Urinary retention -Bladder scan, Place Foley if retaining -Continue Flomax    Osvaldo Shipper, MD

## 2019-12-06 NOTE — Progress Notes (Signed)
  Echocardiogram 2D Echocardiogram has been performed.  Gerda Diss 12/06/2019, 8:28 PM

## 2019-12-07 ENCOUNTER — Inpatient Hospital Stay (HOSPITAL_COMMUNITY): Payer: Medicare HMO

## 2019-12-07 ENCOUNTER — Other Ambulatory Visit: Payer: Self-pay

## 2019-12-07 DIAGNOSIS — J42 Unspecified chronic bronchitis: Secondary | ICD-10-CM

## 2019-12-07 DIAGNOSIS — I480 Paroxysmal atrial fibrillation: Secondary | ICD-10-CM

## 2019-12-07 DIAGNOSIS — I313 Pericardial effusion (noninflammatory): Secondary | ICD-10-CM | POA: Diagnosis not present

## 2019-12-07 DIAGNOSIS — I314 Cardiac tamponade: Secondary | ICD-10-CM | POA: Diagnosis not present

## 2019-12-07 LAB — BODY FLUID CELL COUNT WITH DIFFERENTIAL
Eos, Fluid: 0 %
Lymphs, Fluid: 2 %
Monocyte-Macrophage-Serous Fluid: 1 % — ABNORMAL LOW (ref 50–90)
Neutrophil Count, Fluid: 97 % — ABNORMAL HIGH (ref 0–25)
Total Nucleated Cell Count, Fluid: 39195 cu mm — ABNORMAL HIGH (ref 0–1000)

## 2019-12-07 LAB — SEDIMENTATION RATE: Sed Rate: 36 mm/hr — ABNORMAL HIGH (ref 0–16)

## 2019-12-07 LAB — COMPREHENSIVE METABOLIC PANEL
ALT: 11 U/L (ref 0–44)
AST: 17 U/L (ref 15–41)
Albumin: 2.7 g/dL — ABNORMAL LOW (ref 3.5–5.0)
Alkaline Phosphatase: 54 U/L (ref 38–126)
Anion gap: 10 (ref 5–15)
BUN: 39 mg/dL — ABNORMAL HIGH (ref 8–23)
CO2: 26 mmol/L (ref 22–32)
Calcium: 8.3 mg/dL — ABNORMAL LOW (ref 8.9–10.3)
Chloride: 99 mmol/L (ref 98–111)
Creatinine, Ser: 2.08 mg/dL — ABNORMAL HIGH (ref 0.61–1.24)
GFR calc Af Amer: 32 mL/min — ABNORMAL LOW (ref >60)
GFR calc non Af Amer: 28 mL/min — ABNORMAL LOW (ref >60)
Glucose, Bld: 142 mg/dL — ABNORMAL HIGH (ref 70–99)
Potassium: 5.3 mmol/L — ABNORMAL HIGH (ref 3.5–5.1)
Sodium: 135 mmol/L (ref 135–145)
Total Bilirubin: 1.3 mg/dL — ABNORMAL HIGH (ref 0.3–1.2)
Total Protein: 6.2 g/dL — ABNORMAL LOW (ref 6.5–8.1)

## 2019-12-07 LAB — CBC
HCT: 37.4 % — ABNORMAL LOW (ref 39.0–52.0)
Hemoglobin: 11.9 g/dL — ABNORMAL LOW (ref 13.0–17.0)
MCH: 28.5 pg (ref 26.0–34.0)
MCHC: 31.8 g/dL (ref 30.0–36.0)
MCV: 89.5 fL (ref 80.0–100.0)
Platelets: 181 10*3/uL (ref 150–400)
RBC: 4.18 MIL/uL — ABNORMAL LOW (ref 4.22–5.81)
RDW: 15.7 % — ABNORMAL HIGH (ref 11.5–15.5)
WBC: 15.4 10*3/uL — ABNORMAL HIGH (ref 4.0–10.5)
nRBC: 0 % (ref 0.0–0.2)

## 2019-12-07 LAB — PROTIME-INR
INR: 1.6 — ABNORMAL HIGH (ref 0.8–1.2)
Prothrombin Time: 18.1 seconds — ABNORMAL HIGH (ref 11.4–15.2)

## 2019-12-07 LAB — TSH: TSH: 2.012 u[IU]/mL (ref 0.350–4.500)

## 2019-12-07 LAB — ECHOCARDIOGRAM LIMITED: Height: 72 in

## 2019-12-07 LAB — T4, FREE: Free T4: 1.07 ng/dL (ref 0.61–1.12)

## 2019-12-07 LAB — TROPONIN I (HIGH SENSITIVITY): Troponin I (High Sensitivity): 65 ng/L — ABNORMAL HIGH (ref <18)

## 2019-12-07 LAB — C-REACTIVE PROTEIN: CRP: 27 mg/dL — ABNORMAL HIGH (ref <1.0)

## 2019-12-07 LAB — GLUCOSE, CAPILLARY: Glucose-Capillary: 126 mg/dL — ABNORMAL HIGH (ref 70–99)

## 2019-12-07 LAB — LACTATE DEHYDROGENASE: LDH: 132 U/L (ref 98–192)

## 2019-12-07 MED ORDER — ACETAMINOPHEN-CODEINE #3 300-30 MG PO TABS
1.0000 | ORAL_TABLET | ORAL | Status: DC | PRN
  Administered 2019-12-07: 1 via ORAL
  Filled 2019-12-07: qty 1

## 2019-12-07 MED ORDER — LINEZOLID 600 MG PO TABS
600.0000 mg | ORAL_TABLET | Freq: Two times a day (BID) | ORAL | Status: DC
  Administered 2019-12-07 – 2019-12-08 (×3): 600 mg via ORAL
  Filled 2019-12-07 (×3): qty 1

## 2019-12-07 MED ORDER — SODIUM CHLORIDE 0.9 % IV SOLN
INTRAVENOUS | Status: DC | PRN
  Administered 2019-12-07: 1000 mL via INTRAVENOUS

## 2019-12-07 MED ORDER — LACTATED RINGERS IV BOLUS
500.0000 mL | Freq: Once | INTRAVENOUS | Status: AC
  Administered 2019-12-07: 500 mL via INTRAVENOUS

## 2019-12-07 MED ORDER — PIPERACILLIN-TAZOBACTAM 3.375 G IVPB
3.3750 g | Freq: Three times a day (TID) | INTRAVENOUS | Status: DC
  Administered 2019-12-07: 3.375 g via INTRAVENOUS
  Filled 2019-12-07: qty 50

## 2019-12-07 MED ORDER — SODIUM CHLORIDE 0.9 % IV SOLN
2.0000 g | INTRAVENOUS | Status: DC
  Administered 2019-12-07 – 2019-12-08 (×2): 2 g via INTRAVENOUS
  Filled 2019-12-07: qty 20
  Filled 2019-12-07 (×2): qty 2

## 2019-12-07 MED ORDER — CHEWING GUM (ORBIT) SUGAR FREE: 1.0000 | CHEWING_GUM | Freq: Three times a day (TID) | ORAL | Status: DC

## 2019-12-07 NOTE — Progress Notes (Signed)
Progress Note  Patient Name: Keith Nelson Date of Encounter: 12/07/2019  Primary Cardiologist: Gypsy Balsam, MD     Patient Profile     86 y.o. male admitted 5/21 with recurrent tamponade for which he underwent emergent pericardiocentesis  Initially presented 10/ 19  and found to have transudative effusion, cytology neg for malignancy; reactive cells present and TSH elevated.  Afib assoc with drain in place.  Recurrent accumulation 11/19  >> window by BB w fluid reaccumulation 12/19 w/o symptoms  Fluid collection >> 39K nucleated cell ( 235 11/19)  No organisms seen  Other labs pending  Subjective   Still with some chest discomfort.  His major presenting symptom was exertional chest discomfort.  It has been accompanied by progressive abdominal distention and dyspnea on exertion.  Chest discomfort has been more notable while lying versus sitting  Inpatient Medications    Scheduled Meds: . chlorhexidine  15 mL Mouth Rinse BID  . Chlorhexidine Gluconate Cloth  6 each Topical Daily  . mouth rinse  15 mL Mouth Rinse q12n4p  . sodium chloride flush  10 mL Other Q12H   Continuous Infusions: . sodium chloride 1,000 mL (12/07/19 0706)  . piperacillin-tazobactam (ZOSYN)  IV 3.375 g (12/07/19 0706)   PRN Meds: sodium chloride, acetaminophen **OR** acetaminophen, ondansetron **OR** ondansetron (ZOFRAN) IV   Vital Signs    Vitals:   12/07/19 0500 12/07/19 0600 12/07/19 0630 12/07/19 0700  BP: (!) 103/50 (!) 105/56  (!) 107/53  Pulse:  98    Resp: (!) 26 (!) 23  (!) 27  Temp:      TempSrc:      SpO2: 93% 90%  91%  Weight:   88.1 kg   Height:        Intake/Output Summary (Last 24 hours) at 12/07/2019 0818 Last data filed at 12/06/2019 2300 Gross per 24 hour  Intake 500 ml  Output 200 ml  Net 300 ml   Last 3 Weights 12/07/2019 12/06/2019 06/24/2018  Weight (lbs) 194 lb 3.6 oz 192 lb 3.9 oz 180 lb  Weight (kg) 88.1 kg 87.2 kg 81.647 kg      Telemetry    Atrial  fibrillation with improved rate- Personally Reviewed  ECG    Atrial fibrillation without evidence of ST segment elevation personally Reviewed  Physical Exam    GEN: No acute distress.   Neck:  JVD about 7 cm with positive HJR Cardiac:  Irregular rate and rhythm Respiratory: Clear to auscultation bilaterally. GI: Soft, nontender, non-distended  MS: No edema; No deformity. Neuro:  Nonfocal  Psych: Normal affect   Labs    High Sensitivity Troponin:  No results for input(s): TROPONINIHS in the last 720 hours.    Chemistry Recent Labs  Lab 12/07/19 0110  NA 135  K 5.3*  CL 99  CO2 26  GLUCOSE 142*  BUN 39*  CREATININE 2.08*  CALCIUM 8.3*  PROT 6.2*  ALBUMIN 2.7*  AST 17  ALT 11  ALKPHOS 54  BILITOT 1.3*  GFRNONAA 28*  GFRAA 32*  ANIONGAP 10     Hematology Recent Labs  Lab 12/07/19 0110  WBC 15.4*  RBC 4.18*  HGB 11.9*  HCT 37.4*  MCV 89.5  MCH 28.5  MCHC 31.8  RDW 15.7*  PLT 181   Estimated Creatinine Clearance: 27.5 mL/min (A) (by C-G formula based on SCr of 2.08 mg/dL (H)).  BNPNo results for input(s): BNP, PROBNP in the last 168 hours.   DDimer No results for  input(s): DDIMER in the last 168 hours.   Radiology    CARDIAC CATHETERIZATION  Result Date: 12/06/2019 Successful pericardiocentesis for cardiac tamponade via the subxiphoid approach with removal of 850 cc of brownish straw-colored fluid sent for analysis.  Preprocedure, the patient's pressure was 93/60, pulsus paradox was present approximating 24 mmHg and there was suggestion of pulses alternations on telemetry.  Following thoracentesis pressure increased to 123/68, shortnessness of breath improved, and there was resolution of pulsus alternans on monitoring.  ECHOCARDIOGRAM LIMITED  Result Date: 12/06/2019    ECHOCARDIOGRAM LIMITED REPORT   Patient Name:   Keith Nelson Date of Exam: 12/06/2019 Medical Rec #:  [id number]           Height:       68.0 in Accession #:    [id number]           Weight:       180.0 lb Date of Birth:  29-Jun-1930          BSA:          1.954 m Patient Age:    52 years            BP:           96/68 mmHg Patient Gender: M                   HR:           161 bpm. Exam Location:  Inpatient Procedure: Limited Echo, Cardiac Doppler and Color Doppler Indications:    Pericardial effusion  History:        Patient has prior history of Echocardiogram examinations, most                 recent 06/07/2018. COPD, Arrythmias:Atrial Fibrillation; Risk                 Factors:Hypertension. CKD.  Sonographer:    Clayton Lefort RDCS (AE) Referring Phys: [id number] [facility] E ARPS  Sonographer Comments: Image acquisition challenging due to COPD. IMPRESSIONS  1. An urgent pericardiocentesis or pericardial window is indicated.  2. Large circumferential pericardial effusion with maximum diameter 3 cm posteriorly. There are signs of tamponade with > 20 % respiratory variability on mitral inflow, poor RV filling, dilated IVC.  3. LVEF can't be assessed as the heart rate is 160 BPM.  4. Large pericardial effusion. The pericardial effusion is circumferential. Findings are consistent with cardiac tamponade. FINDINGS  Pericardium: A large pericardial effusion is present. The pericardial effusion is circumferential. There is excessive respiratory variation in the mitral valve spectral Doppler velocities and excessive respiratory variation in septal movement. There is evidence of cardiac tamponade.  IVC IVC diam: 2.40 cm Ena Dawley MD Electronically signed by Ena Dawley MD Signature Date/Time: 12/06/2019/8:48:13 PM    Final     Cardiac Studies     Assessment & Plan    Pericardial effusion recurrent   Leukocytosis  COPD   Chronic CAD ( CT >> CA calcification )   Afib persistent?     Recurrent tamponade in the absence of systemic symptoms.  White count was reviewed with ID.  Question inflammatory versus infectious.  They will review and offer input.  Continue Zosyn for now.  Will use low  dose colchicine for now  Consult CT surgery for repeat window  Other lab parameters to distinguish transudate versus exudate are pending  His ECG was not suggestive of acute coronary syndrome; his symptoms however were concerning.  We will  check a troponin.  Does have coronary artery calcification by CT scanning  We will need long-term anticoagulation.  We will hold off as we try to sort out next procedures.  We will leave drain in place overnight.   For questions or updates, please contact [facility] Please consult [email or web address] for contact info under        Signed, Sherryl Manges, MD  12/07/2019, 8:18 AM

## 2019-12-07 NOTE — Consult Note (Signed)
Date of Admission:  12/06/2019          Reason for Consult: Exudative pericardial effusion rule infectious pericarditis    Referring Provider: Dr. Graciela Husbands   Assessment:  1. Recurrent pericardial effusion now exudative and concerning for infectious pericarditis 2. PAF 3. COPD  Plan:  1. Change antibiotics to Ceftriaxone and vancomycin 2. When he goes for pericardial window, send LARGE VOLUME for AFB culture and also for FUNGAL culture 3. Would also send for cytology with flow 4. I will send serum cryptococcal ag 5. I will check QF gold, histoplasma ag urine, blastomcyes ag urine, coccidioides abs in serum  Principal Problem:   Pericardial effusion Active Problems:   Essential hypertension   COPD (chronic obstructive pulmonary disease) (HCC)   Pericardial effusion with cardiac tamponade   Paroxysmal atrial fibrillation with RVR (HCC)   Hypothyroidism   CKD (chronic kidney disease), stage III   Acute on chronic respiratory failure with hypoxia (HCC)   Scheduled Meds: . chlorhexidine  15 mL Mouth Rinse BID  . Chlorhexidine Gluconate Cloth  6 each Topical Daily  . linezolid  600 mg Oral Q12H  . mouth rinse  15 mL Mouth Rinse q12n4p  . sodium chloride flush  10 mL Other Q12H   Continuous Infusions: . sodium chloride 10 mL/hr at 12/07/19 1200  . cefTRIAXone (ROCEPHIN)  IV 2 g (12/07/19 1504)   PRN Meds:.sodium chloride, acetaminophen **OR** acetaminophen, acetaminophen-codeine, ondansetron **OR** ondansetron (ZOFRAN) IV  HPI: Keith Nelson is a 86 y.o. male with hx of HTN< CKD, hypothyroidism, COPD on 2L via [place]. He had prior transudative pericardial effusion in October of 2019. He had recurrence in November of 2019 with pericardial window. He now presents with tamponade from recurrent effusion and underwent emergent pericardiocentesis. This time the cell count was 39195K 97% PMN's. GS is negative. He is on zosyn. I am changing him to zyvox and ceftriaxone. He has  extensive travel history including to [place], [place] and would have had risk for multiple endemic fungi. He has not travelled abroad and has never been incarcerated. He has not been exposed to someone with TB.   Review of Systems: Review of Systems  Constitutional: Negative for chills, fever, malaise/fatigue and weight loss.  HENT: Negative for congestion and sore throat.   Eyes: Negative for blurred vision and photophobia.  Respiratory: Positive for shortness of breath. Negative for cough and wheezing.   Cardiovascular: Positive for chest pain. Negative for palpitations and leg swelling.  Gastrointestinal: Positive for abdominal pain. Negative for blood in stool, constipation, diarrhea, heartburn, melena, nausea and vomiting.  Genitourinary: Negative for dysuria, flank pain and hematuria.  Musculoskeletal: Negative for back pain, falls, joint pain and myalgias.  Skin: Negative for itching and rash.  Neurological: Negative for dizziness, focal weakness, loss of consciousness, weakness and headaches.  Endo/Heme/Allergies: Does not bruise/bleed easily.  Psychiatric/Behavioral: Negative for depression and suicidal ideas. The patient does not have insomnia.     Past Medical History:  Diagnosis Date  . Anemia   . Arthritis    "joints ache sometimes" (05/07/2018)  . CKD (chronic kidney disease), stage II    "stage II or III" (05/07/2018)  . COPD (chronic obstructive pulmonary disease) (HCC)   . High cholesterol   . History of blood transfusion 2017  . Hypertension   . Hypothyroidism   . On home oxygen therapy    "just at night" (05/07/2018)  . Pericardial effusion 05/07/2018  . Pneumonia 2009; ~ 2017;  02/2018    Social History   Tobacco Use  . Smoking status: Former Smoker    Packs/day: 2.00    Years: 35.00    Pack years: 70.00    Types: Cigarettes    Quit date: 1980    Years since quitting: 41.3  . Smokeless tobacco: Never Used  Substance Use Topics  . Alcohol use: Not  Currently  . Drug use: Never    Family History  Problem Relation Age of Onset  . Heart failure Mother    No Known Allergies  OBJECTIVE: Blood pressure (!) 101/56, pulse 83, temperature 98.3 F (36.8 C), temperature source Oral, resp. rate (!) 23, height 6' (1.829 m), weight 88.1 kg, SpO2 94 %.  Physical Exam Constitutional:      Appearance: He is well-developed.  HENT:     Head: Normocephalic and atraumatic.  Eyes:     General:        Right eye: No discharge.        Left eye: No discharge.     Conjunctiva/sclera: Conjunctivae normal.  Cardiovascular:     Rate and Rhythm: Normal rate and regular rhythm.     Heart sounds: No murmur. Friction rub present. No gallop.   Pulmonary:     Effort: Pulmonary effort is normal. No respiratory distress.     Breath sounds: No stridor. No wheezing or rhonchi.  Abdominal:     General: There is distension.     Palpations: Abdomen is soft.  Musculoskeletal:        General: No tenderness. Normal range of motion.     Cervical back: Normal range of motion and neck supple.  Skin:    General: Skin is warm and dry.     Coloration: Skin is not pale.     Findings: No erythema or rash.  Neurological:     General: No focal deficit present.     Mental Status: He is alert and oriented to person, place, and time.  Psychiatric:        Mood and Affect: Mood normal.        Behavior: Behavior normal.        Thought Content: Thought content normal.        Judgment: Judgment normal.    Pericardial drain in place  Lab Results Lab Results  Component Value Date   WBC 15.4 (H) 12/07/2019   HGB 11.9 (L) 12/07/2019   HCT 37.4 (L) 12/07/2019   MCV 89.5 12/07/2019   PLT 181 12/07/2019    Lab Results  Component Value Date   CREATININE 2.08 (H) 12/07/2019   BUN 39 (H) 12/07/2019   NA 135 12/07/2019   K 5.3 (H) 12/07/2019   CL 99 12/07/2019   CO2 26 12/07/2019    Lab Results  Component Value Date   ALT 11 12/07/2019   AST 17 12/07/2019    ALKPHOS 54 12/07/2019   BILITOT 1.3 (H) 12/07/2019     Microbiology: Recent Results (from the past 240 hour(s))  Surgical PCR screen     Status: None   Collection Time: 12/06/19  9:18 PM   Specimen: Nasal Mucosa; Nasal Swab  Result Value Ref Range Status   MRSA, PCR NEGATIVE NEGATIVE Final   Staphylococcus aureus NEGATIVE NEGATIVE Final    Comment: (NOTE) The Xpert SA Assay (FDA approved for NASAL specimens in patients 41 years of age and older), is one component of a comprehensive surveillance program. It is not intended to diagnose infection nor to guide  or monitor treatment. Performed at [facility], [place] [address], [place], [place] [address]   Body fluid culture     Status: None (Preliminary result)   Collection Time: 12/06/19  9:52 PM   Specimen: PATH Cytology Misc. fluid; Body Fluid  Result Value Ref Range Status   Specimen Description FLUID  Final   Special Requests PERICARDIOCENTESIS SYRINGE  Final   Gram Stain   Final    MODERATE WBC PRESENT, PREDOMINANTLY PMN NO ORGANISMS SEEN    Culture   Final    NO GROWTH < 12 HOURS Performed at [facility] [address], [place], [place] [address]    Report Status PENDING  Incomplete    Alcide Evener, [place] [facility] [phone] pager  12/07/2019, 5:41 PM

## 2019-12-07 NOTE — Progress Notes (Signed)
Pharmacy Antibiotic Note  Keith Nelson is a 86 y.o. male admitted on 12/06/2019 with concern for secondary bacterial infection of pericardial effusion.  Pharmacy has been consulted for zosyn dosing.  Plan: Zosyn 3.375 gm IV q8 hours F/u cultures and clinical course  Height: 6' (182.9 cm) Weight: 88.1 kg (194 lb 3.6 oz) IBW/kg (Calculated) : 77.6  Temp (24hrs), Avg:98.5 F (36.9 C), Min:97.8 F (36.6 C), Max:98.9 F (37.2 C)  Recent Labs  Lab 12/07/19 0110  WBC 15.4*  CREATININE 2.08*    Estimated Creatinine Clearance: 27.5 mL/min (A) (by C-G formula based on SCr of 2.08 mg/dL (H)).    No Known Allergies  Thank you for allowing pharmacy to be a part of this patient's care.  Talbert Cage Poteet 12/07/2019 6:41 AM
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
## 2019-12-08 ENCOUNTER — Inpatient Hospital Stay (HOSPITAL_COMMUNITY): Payer: Medicare HMO | Admitting: Certified Registered"

## 2019-12-08 ENCOUNTER — Inpatient Hospital Stay (HOSPITAL_COMMUNITY): Payer: Medicare HMO

## 2019-12-08 ENCOUNTER — Encounter (HOSPITAL_COMMUNITY)
Admission: AD | Disposition: A | Discharge status: Home / Self Care | Payer: Self-pay | Source: Other Acute Inpatient Hospital | Attending: Cardiovascular Disease

## 2019-12-08 ENCOUNTER — Encounter (HOSPITAL_COMMUNITY): Payer: Self-pay | Admitting: Internal Medicine

## 2019-12-08 DIAGNOSIS — I313 Pericardial effusion (noninflammatory): Secondary | ICD-10-CM | POA: Diagnosis not present

## 2019-12-08 DIAGNOSIS — N1831 Chronic kidney disease, stage 3a: Secondary | ICD-10-CM | POA: Diagnosis not present

## 2019-12-08 HISTORY — PX: XI ROBOTIC ASSISTED PERICARDIAL WINDOW: SHX6870

## 2019-12-08 LAB — CYTOLOGY - NON PAP

## 2019-12-08 LAB — GLUCOSE, CAPILLARY
Glucose-Capillary: 145 mg/dL — ABNORMAL HIGH (ref 70–99)
Glucose-Capillary: 148 mg/dL — ABNORMAL HIGH (ref 70–99)

## 2019-12-08 LAB — TYPE AND SCREEN
ABO/RH(D): O NEG
Antibody Screen: NEGATIVE

## 2019-12-08 LAB — RESPIRATORY PANEL BY RT PCR (FLU A&B, COVID)
Influenza A by PCR: NEGATIVE
Influenza B by PCR: NEGATIVE
SARS Coronavirus 2 by RT PCR: NEGATIVE

## 2019-12-08 LAB — PH, BODY FLUID: pH, Body Fluid: 7.1

## 2019-12-08 LAB — HIV ANTIBODY (ROUTINE TESTING W REFLEX): HIV Screen 4th Generation wRfx: NONREACTIVE

## 2019-12-08 LAB — HEPATITIS C ANTIBODY: HCV Ab: NONREACTIVE

## 2019-12-08 LAB — CRYPTOCOCCAL ANTIGEN: Crypto Ag: NEGATIVE

## 2019-12-08 LAB — LD, BODY FLUID (OTHER): LD, Body Fluid: 2443 IU/L

## 2019-12-08 LAB — PROTEIN, BODY FLUID (OTHER): Total Protein, Body Fluid Other: 5.3 g/dL

## 2019-12-08 LAB — GLUCOSE, BODY FLUID OTHER: Glucose, Body Fluid Other: 4 mg/dL

## 2019-12-08 LAB — C-REACTIVE PROTEIN: CRP: 35.1 mg/dL — ABNORMAL HIGH (ref <1.0)

## 2019-12-08 LAB — SEDIMENTATION RATE: Sed Rate: 53 mm/hr — ABNORMAL HIGH (ref 0–16)

## 2019-12-08 SURGERY — CREATION, PERICARDIAL WINDOW, ROBOT-ASSISTED
Anesthesia: General | Site: Chest | Laterality: Right

## 2019-12-08 MED ORDER — DEXAMETHASONE SODIUM PHOSPHATE 10 MG/ML IJ SOLN
INTRAMUSCULAR | Status: AC
  Filled 2019-12-08: qty 1

## 2019-12-08 MED ORDER — TRAMADOL HCL 50 MG PO TABS
50.0000 mg | ORAL_TABLET | Freq: Four times a day (QID) | ORAL | Status: DC | PRN
  Administered 2019-12-08 – 2019-12-09 (×2): 100 mg via ORAL
  Filled 2019-12-08: qty 1
  Filled 2019-12-08 (×2): qty 2

## 2019-12-08 MED ORDER — ONDANSETRON HCL 4 MG/2ML IJ SOLN
INTRAMUSCULAR | Status: DC | PRN
  Administered 2019-12-08: 4 mg via INTRAVENOUS

## 2019-12-08 MED ORDER — BUPIVACAINE HCL (PF) 0.25 % IJ SOLN
INTRAMUSCULAR | Status: AC
  Filled 2019-12-08: qty 30

## 2019-12-08 MED ORDER — FENTANYL CITRATE (PF) 250 MCG/5ML IJ SOLN
INTRAMUSCULAR | Status: AC
  Filled 2019-12-08: qty 5

## 2019-12-08 MED ORDER — ORAL CARE MOUTH RINSE
15.0000 mL | Freq: Two times a day (BID) | OROMUCOSAL | Status: DC
  Administered 2019-12-08 – 2019-12-16 (×14): 15 mL via OROMUCOSAL

## 2019-12-08 MED ORDER — DEXAMETHASONE SODIUM PHOSPHATE 10 MG/ML IJ SOLN
INTRAMUSCULAR | Status: DC | PRN
  Administered 2019-12-08: 6 mg via INTRAVENOUS

## 2019-12-08 MED ORDER — PROPOFOL 10 MG/ML IV BOLUS
INTRAVENOUS | Status: AC
  Filled 2019-12-08: qty 20

## 2019-12-08 MED ORDER — ONDANSETRON HCL 4 MG/2ML IJ SOLN: 4.0000 mg | Freq: Four times a day (QID) | INTRAMUSCULAR | Status: DC | PRN

## 2019-12-08 MED ORDER — 0.9 % SODIUM CHLORIDE (POUR BTL) OPTIME
TOPICAL | Status: DC | PRN
  Administered 2019-12-08: 2000 mL

## 2019-12-08 MED ORDER — SODIUM CHLORIDE 0.9% FLUSH: 10.0000 mL | INTRAVENOUS | Status: DC | PRN

## 2019-12-08 MED ORDER — SODIUM CHLORIDE 0.9% FLUSH
10.0000 mL | Freq: Two times a day (BID) | INTRAVENOUS | Status: DC
  Administered 2019-12-08: 10 mL
  Administered 2019-12-09: 22:00:00 20 mL
  Administered 2019-12-09: 08:00:00 10 mL

## 2019-12-08 MED ORDER — ROCURONIUM BROMIDE 10 MG/ML (PF) SYRINGE
PREFILLED_SYRINGE | INTRAVENOUS | Status: AC
  Filled 2019-12-08: qty 10

## 2019-12-08 MED ORDER — SUGAMMADEX SODIUM 200 MG/2ML IV SOLN
INTRAVENOUS | Status: DC | PRN
  Administered 2019-12-08: 200 mg via INTRAVENOUS

## 2019-12-08 MED ORDER — PHENYLEPHRINE 40 MCG/ML (10ML) SYRINGE FOR IV PUSH (FOR BLOOD PRESSURE SUPPORT)
PREFILLED_SYRINGE | INTRAVENOUS | Status: DC | PRN
  Administered 2019-12-08: 40 ug via INTRAVENOUS
  Administered 2019-12-08: 120 ug via INTRAVENOUS
  Administered 2019-12-08 (×2): 40 ug via INTRAVENOUS

## 2019-12-08 MED ORDER — SODIUM CHLORIDE 0.9 % IV SOLN
INTRAVENOUS | Status: DC | PRN
Start: 2019-12-08 — End: 2019-12-08

## 2019-12-08 MED ORDER — CHLORHEXIDINE GLUCONATE CLOTH 2 % EX PADS
6.0000 | MEDICATED_PAD | Freq: Every day | CUTANEOUS | Status: DC
  Administered 2019-12-08 – 2019-12-12 (×5): 6 via TOPICAL

## 2019-12-08 MED ORDER — GLYCOPYRROLATE PF 0.2 MG/ML IJ SOSY
PREFILLED_SYRINGE | INTRAMUSCULAR | Status: AC
  Filled 2019-12-08: qty 1

## 2019-12-08 MED ORDER — PHENYLEPHRINE 40 MCG/ML (10ML) SYRINGE FOR IV PUSH (FOR BLOOD PRESSURE SUPPORT)
PREFILLED_SYRINGE | INTRAVENOUS | Status: AC
  Filled 2019-12-08: qty 10

## 2019-12-08 MED ORDER — SENNOSIDES-DOCUSATE SODIUM 8.6-50 MG PO TABS
1.0000 | ORAL_TABLET | Freq: Every day | ORAL | Status: DC
  Administered 2019-12-08 – 2019-12-15 (×6): 1 via ORAL
  Filled 2019-12-08 (×8): qty 1

## 2019-12-08 MED ORDER — FENTANYL CITRATE (PF) 100 MCG/2ML IJ SOLN
INTRAMUSCULAR | Status: DC | PRN
  Administered 2019-12-08: 100 ug via INTRAVENOUS
  Administered 2019-12-08 (×2): 50 ug via INTRAVENOUS

## 2019-12-08 MED ORDER — ONDANSETRON HCL 4 MG/2ML IJ SOLN
INTRAMUSCULAR | Status: AC
  Filled 2019-12-08: qty 2

## 2019-12-08 MED ORDER — LIDOCAINE 2% (20 MG/ML) 5 ML SYRINGE
INTRAMUSCULAR | Status: AC
  Filled 2019-12-08: qty 5

## 2019-12-08 MED ORDER — SODIUM CHLORIDE 0.9 % IV SOLN: INTRAVENOUS | Status: DC | PRN

## 2019-12-08 MED ORDER — TAMSULOSIN HCL 0.4 MG PO CAPS
0.4000 mg | ORAL_CAPSULE | Freq: Every day | ORAL | Status: DC
  Administered 2019-12-09 – 2019-12-16 (×8): 0.4 mg via ORAL
  Filled 2019-12-08 (×8): qty 1

## 2019-12-08 MED ORDER — ACETAMINOPHEN 500 MG PO TABS
1000.0000 mg | ORAL_TABLET | Freq: Four times a day (QID) | ORAL | Status: AC
  Administered 2019-12-08 – 2019-12-13 (×18): 1000 mg via ORAL
  Filled 2019-12-08 (×18): qty 2

## 2019-12-08 MED ORDER — ROCURONIUM BROMIDE 10 MG/ML (PF) SYRINGE
PREFILLED_SYRINGE | INTRAVENOUS | Status: DC | PRN
  Administered 2019-12-08: 50 mg via INTRAVENOUS

## 2019-12-08 MED ORDER — BUPIVACAINE HCL 0.25 % IJ SOLN
INTRAMUSCULAR | Status: DC | PRN
  Administered 2019-12-08: 30 mL

## 2019-12-08 MED ORDER — PROPOFOL 10 MG/ML IV BOLUS
INTRAVENOUS | Status: DC | PRN
  Administered 2019-12-08: 70 mg via INTRAVENOUS

## 2019-12-08 MED ORDER — BISACODYL 5 MG PO TBEC
10.0000 mg | DELAYED_RELEASE_TABLET | Freq: Every day | ORAL | Status: DC
  Administered 2019-12-10: 09:00:00 10 mg via ORAL
  Filled 2019-12-08 (×6): qty 2

## 2019-12-08 MED ORDER — PHENYLEPHRINE HCL-NACL 20-0.9 MG/250ML-% IV SOLN
INTRAVENOUS | Status: DC | PRN
  Administered 2019-12-08: 30 ug/min via INTRAVENOUS

## 2019-12-08 MED ORDER — ACETAMINOPHEN 160 MG/5ML PO SOLN
1000.0000 mg | Freq: Four times a day (QID) | ORAL | Status: AC
  Filled 2019-12-08: qty 40.6

## 2019-12-08 MED ORDER — SODIUM CHLORIDE 0.9% IV SOLUTION: Freq: Once | INTRAVENOUS | Status: DC

## 2019-12-08 MED ORDER — LIDOCAINE 2% (20 MG/ML) 5 ML SYRINGE
INTRAMUSCULAR | Status: DC | PRN
  Administered 2019-12-08: 30 mg via INTRAVENOUS

## 2019-12-08 MED ORDER — LACTATED RINGERS IV SOLN: INTRAVENOUS | Status: DC | PRN

## 2019-12-08 MED ORDER — INSULIN ASPART 100 UNIT/ML ~~LOC~~ SOLN
0.0000 [IU] | Freq: Three times a day (TID) | SUBCUTANEOUS | Status: DC
  Administered 2019-12-08 – 2019-12-12 (×7): 2 [IU] via SUBCUTANEOUS
  Administered 2019-12-13: 4 [IU] via SUBCUTANEOUS

## 2019-12-08 MED ORDER — LEVOTHYROXINE SODIUM 112 MCG PO TABS
112.0000 ug | ORAL_TABLET | Freq: Every day | ORAL | Status: DC
  Administered 2019-12-09 – 2019-12-16 (×8): 112 ug via ORAL
  Filled 2019-12-08 (×9): qty 1

## 2019-12-08 SURGICAL SUPPLY — 55 items
BLADE STERNUM SYSTEM 6 (BLADE) ×3 IMPLANT
CONNECTOR BLAKE 2:1 CARIO BLK (MISCELLANEOUS) IMPLANT
DEFOGGER SCOPE WARMER CLEARIFY (MISCELLANEOUS) ×3 IMPLANT
DERMABOND ADVANCED (GAUZE/BANDAGES/DRESSINGS) ×2
DERMABOND ADVANCED .7 DNX12 (GAUZE/BANDAGES/DRESSINGS) ×1 IMPLANT
DRAIN CHANNEL 19F RND (DRAIN) ×2 IMPLANT
DRAIN CONNECTOR BLAKE 1:1 (MISCELLANEOUS) ×2 IMPLANT
DRAPE ARM DVNC X/XI (DISPOSABLE) ×4 IMPLANT
DRAPE COLUMN DVNC XI (DISPOSABLE) ×1 IMPLANT
DRAPE CV SPLIT W-CLR ANES SCRN (DRAPES) ×3 IMPLANT
DRAPE DA VINCI XI ARM (DISPOSABLE) ×8
DRAPE DA VINCI XI COLUMN (DISPOSABLE) ×2
DRAPE SLUSH/WARMER DISC (DRAPES) IMPLANT
DRSG AQUACEL AG ADV 3.5X14 (GAUZE/BANDAGES/DRESSINGS) IMPLANT
ELECT REM PT RETURN 9FT ADLT (ELECTROSURGICAL)
ELECTRODE REM PT RTRN 9FT ADLT (ELECTROSURGICAL) IMPLANT
FELT TEFLON 1X6 (MISCELLANEOUS) IMPLANT
GAUZE SPONGE 4X4 12PLY STRL (GAUZE/BANDAGES/DRESSINGS) ×2 IMPLANT
GLOVE BIO SURGEON STRL SZ7 (GLOVE) ×6 IMPLANT
GOWN STRL REUS W/ TWL LRG LVL3 (GOWN DISPOSABLE) ×1 IMPLANT
GOWN STRL REUS W/ TWL XL LVL3 (GOWN DISPOSABLE) ×1 IMPLANT
GOWN STRL REUS W/TWL LRG LVL3 (GOWN DISPOSABLE) ×2
GOWN STRL REUS W/TWL XL LVL3 (GOWN DISPOSABLE) ×2
HEMOSTAT POWDER SURGIFOAM 1G (HEMOSTASIS) IMPLANT
IRRIGATION STRYKERFLOW (MISCELLANEOUS) ×1 IMPLANT
IRRIGATOR STRYKERFLOW (MISCELLANEOUS) ×3
KIT SUCTION CATH 14FR (SUCTIONS) IMPLANT
NDL HYPO 25GX1X1/2 BEV (NEEDLE) IMPLANT
NEEDLE HYPO 25GX1X1/2 BEV (NEEDLE) IMPLANT
PACK CHEST (CUSTOM PROCEDURE TRAY) IMPLANT
PAD ARMBOARD 7.5X6 YLW CONV (MISCELLANEOUS) ×6 IMPLANT
PAD ELECT DEFIB RADIOL ZOLL (MISCELLANEOUS) ×3 IMPLANT
SEAL CANN UNIV 5-8 DVNC XI (MISCELLANEOUS) ×3 IMPLANT
SEAL XI 5MM-8MM UNIVERSAL (MISCELLANEOUS) ×6
SET TUBE SMOKE EVAC HIGH FLOW (TUBING) ×3 IMPLANT
STOPCOCK 4 WAY LG BORE MALE ST (IV SETS) IMPLANT
SUT BONE WAX W31G (SUTURE) IMPLANT
SUT MNCRL AB 3-0 PS2 18 (SUTURE) IMPLANT
SUT PDS AB 1 CTX 36 (SUTURE) IMPLANT
SUT SILK  1 MH (SUTURE) ×2
SUT SILK 1 MH (SUTURE) IMPLANT
SUT SILK 2 0 SH CR/8 (SUTURE) IMPLANT
SUT STEEL 6MS V (SUTURE) IMPLANT
SUT STEEL SZ 6 DBL 3X14 BALL (SUTURE) IMPLANT
SUT VIC AB 2-0 CTX 36 (SUTURE) IMPLANT
SUT VIC AB 3-0 SH 27 (SUTURE) ×2
SUT VIC AB 3-0 SH 27X BRD (SUTURE) IMPLANT
SUT VICRYL 0 UR6 27IN ABS (SUTURE) ×6 IMPLANT
SYR 50ML LL SCALE MARK (SYRINGE) IMPLANT
SYSTEM SAHARA CHEST DRAIN ATS (WOUND CARE) IMPLANT
TAPE CLOTH SURG 6X10 WHT LF (GAUZE/BANDAGES/DRESSINGS) ×2 IMPLANT
TOWEL GREEN STERILE (TOWEL DISPOSABLE) IMPLANT
TRAP SPECIMEN MUCUS 40CC (MISCELLANEOUS) ×6 IMPLANT
TRAY FOLEY SLVR 16FR TEMP STAT (SET/KITS/TRAYS/PACK) IMPLANT
TUBING EXTENTION W/L.L. (IV SETS) IMPLANT

## 2019-12-08 NOTE — Progress Notes (Signed)
Patient sent with CRNA and OR RN to OR.

## 2019-12-08 NOTE — Anesthesia Preprocedure Evaluation (Addendum)
Anesthesia Evaluation  Patient identified by MRN, date of birth, ID band Patient awake    Reviewed: Allergy & Precautions, NPO status , Patient's Chart, lab work & pertinent test results  Airway Mallampati: II  TM Distance: >3 FB Neck ROM: Full    Dental  (+) Edentulous Lower, Edentulous Upper   Pulmonary COPD, former smoker,    breath sounds clear to auscultation       Cardiovascular hypertension, Pt. on medications  Rhythm:Regular Rate:Normal     Neuro/Psych negative neurological ROS  negative psych ROS   GI/Hepatic Neg liver ROS, GERD  Medicated,  Endo/Other  Hypothyroidism   Renal/GU CRFRenal disease     Musculoskeletal  (+) Arthritis ,   Abdominal Normal abdominal exam  (+)   Peds  Hematology negative hematology ROS (+)   Anesthesia Other Findings   Reproductive/Obstetrics                            Anesthesia Physical Anesthesia Plan  ASA: III  Anesthesia Plan: General   Post-op Pain Management:    Induction: Intravenous  PONV Risk Score and Plan: 3 and Ondansetron and Treatment may vary due to age or medical condition  Airway Management Planned: Oral ETT  Additional Equipment: None  Intra-op Plan:   Post-operative Plan: Extubation in OR  Informed Consent: I have reviewed the patients History and Physical, chart, labs and discussed the procedure including the risks, benefits and alternatives for the proposed anesthesia with the patient or authorized representative who has indicated his/her understanding and acceptance.     Dental advisory given  Plan Discussed with: CRNA  Anesthesia Plan Comments:        Anesthesia Quick Evaluation

## 2019-12-08 NOTE — Brief Op Note (Signed)
12/06/2019 - 12/08/2019  12:13 PM  PATIENT:  Keith Nelson  86 y.o. male  PRE-OPERATIVE DIAGNOSIS:  pericardial window  POST-OPERATIVE DIAGNOSIS:  * No post-op diagnosis entered *  PROCEDURE:  Procedure(s) with comments: XI ROBOTIC ASSISTED THORACOSCOPY PERICARDIAL WINDOW (Right) - 1st case   SURGEON:  Surgeon(s) and Role:    * Lightfoot, Eliezer Lofts, MD - Primary  PHYSICIAN ASSISTANT:  Jari Favre, PA-C   ANESTHESIA:   general  EBL: Minimum  BLOOD ADMINISTERED:none  DRAINS: PERICARDIAL DRAIN   LOCAL MEDICATIONS USED:  BUPIVICAINE   SPECIMEN:  Source of Specimen:  PERICARDIUM   DISPOSITION OF SPECIMEN:  PATHOLOGY  COUNTS:  YES  DICTATION: .Dragon Dictation  PLAN OF CARE: Admit to inpatient   PATIENT DISPOSITION:  PACU - hemodynamically stable.   Delay start of Pharmacological VTE agent (>24hrs) due to surgical blood loss or risk of bleeding: yes

## 2019-12-08 NOTE — Progress Notes (Signed)
Review of patient record for recent covid test. No test result identified for this admission. Per transfer note patient refused testing. Call placed to Dr Cliffton Asters to inform of patient refusal. Case order changed to allow for room cleaning. OR front desk Engineer, civil (consulting)) and charge CRNA [facility]) notified.

## 2019-12-08 NOTE — Anesthesia Postprocedure Evaluation (Signed)
Anesthesia Post Note  Patient: Keith Nelson  Procedure(s) Performed: XI ROBOTIC ASSISTED THORACOSCOPY PERICARDIAL WINDOW (Right Chest)     Patient location during evaluation: SICU Anesthesia Type: General Level of consciousness: awake Pain management: pain level controlled Vital Signs Assessment: post-procedure vital signs reviewed and stable Respiratory status: patient remains intubated per anesthesia plan Cardiovascular status: stable Postop Assessment: no apparent nausea or vomiting Anesthetic complications: no    Last Vitals:  Vitals:   12/08/19 1300 12/08/19 1315  BP: 132/72 130/67  Pulse: 83 81  Resp: 20 20  Temp:    SpO2: 92% 95%    Last Pain:  Vitals:   12/08/19 1300  TempSrc:   PainSc: 5                  Shelton Silvas

## 2019-12-08 NOTE — Progress Notes (Signed)
     [address]       [place] [address]             [phone]       No events  Vitals:   12/08/19 0748 12/08/19 0800  BP:  116/70  Pulse:  70  Resp:  20  Temp: 98.1 F (36.7 C)   SpO2:  96%   Afib, rate controlled EWOB  86 yo with recurrent pericardial effusion OR today to Right RATS, pericardial window.  Keith Nelson Scrape

## 2019-12-08 NOTE — Anesthesia Procedure Notes (Signed)
Procedure Name: Intubation Date/Time: 12/08/2019 10:57 AM Performed by: Orlie Dakin, CRNA Pre-anesthesia Checklist: Patient identified, Emergency Drugs available, Suction available and Patient being monitored Patient Re-evaluated:Patient Re-evaluated prior to induction Oxygen Delivery Method: Circle system utilized Preoxygenation: Pre-oxygenation with 100% oxygen Induction Type: IV induction Ventilation: Mask ventilation without difficulty Laryngoscope Size: Mac and 4 Grade View: Grade I Tube type: Oral Endobronchial tube: Left, Bronchial Blocker placed under direct vision, EBT position confirmed by fiberoptic bronchoscope and Double lumen EBT and 39 Fr Number of attempts: 1 Airway Equipment and Method: Stylet and Fiberoptic brochoscope Placement Confirmation: ETT inserted through vocal cords under direct vision and positive ETCO2 Tube secured with: Tape Dental Injury: Teeth and Oropharynx as per pre-operative assessment  Comments: Viva Sight EBL used.

## 2019-12-08 NOTE — Transfer of Care (Signed)
Immediate Anesthesia Transfer of Care Note  Patient: Keith Nelson  Procedure(s) Performed: XI ROBOTIC ASSISTED THORACOSCOPY PERICARDIAL WINDOW (Right Chest)  Patient Location: ICU  Anesthesia Type:General  Level of Consciousness: awake, alert  and patient cooperative  Airway & Oxygen Therapy: Patient Spontanous Breathing and Patient connected to face mask oxygen  Post-op Assessment: Report given to RN and Post -op Vital signs reviewed and stable  Post vital signs: Reviewed and stable  Last Vitals:  Vitals Value Taken Time  BP    Temp    Pulse 85 12/08/19 1243  Resp 22 12/08/19 1243  SpO2 88 % 12/08/19 1243  Vitals shown include unvalidated device data.  Last Pain:  Vitals:   12/08/19 0800  TempSrc:   PainSc: 0-No pain         Complications: No apparent anesthesia complications

## 2019-12-08 NOTE — Anesthesia Procedure Notes (Addendum)
Central Venous Catheter Insertion Performed by: Shelton Silvas, MD, anesthesiologist Start/End5/10/2019 10:35 AM, 12/08/2019 10:45 AM Patient location: Pre-op. Preanesthetic checklist: patient identified, IV checked, site marked, risks and benefits discussed, surgical consent, monitors and equipment checked, pre-op evaluation, timeout performed and anesthesia consent Position: Trendelenburg Lidocaine 1% used for infiltration and patient sedated Hand hygiene performed , maximum sterile barriers used  and Seldinger technique used Catheter size: 8.5 Fr Total catheter length 10. Central line was placed.Sheath introducer Swan type:thermodilution PA Cath depth:50 Procedure performed using ultrasound guided technique. Ultrasound Notes:anatomy identified, needle tip was noted to be adjacent to the nerve/plexus identified, no ultrasound evidence of intravascular and/or intraneural injection and image(s) printed for medical record Attempts: 1 Following insertion, line sutured and dressing applied. Post procedure assessment: blood return through all ports, free fluid flow and no air  Patient tolerated the procedure well with no immediate complications.

## 2019-12-08 NOTE — Anesthesia Procedure Notes (Signed)
Arterial Line Insertion Start/End5/10/2019 10:40 AM, 12/08/2019 11:03 AM Performed by: Dorie Rank, CRNA, CRNA  Preanesthetic checklist: patient identified, IV checked, risks and benefits discussed, surgical consent, monitors and equipment checked, pre-op evaluation and timeout performed Right, radial was placed Catheter size: 20 G Hand hygiene performed , maximum sterile barriers used  and Seldinger technique used Allen's test indicative of satisfactory collateral circulation Attempts: 2 Procedure performed without using ultrasound guided technique. Following insertion, Biopatch and dressing applied. Post procedure assessment: normal  Patient tolerated the procedure well with no immediate complications.

## 2019-12-08 NOTE — Progress Notes (Signed)
CT surgery  Stable after R Robotic vats/pericardial window JP drain 250 cc last 8 hrs Blood pressure 118/68, pulse 85, temperature 97.7 F (36.5 C), temperature source Oral, resp. rate 17, height 6' (1.829 m), weight 86.4 kg, SpO2 91 %.

## 2019-12-08 NOTE — Progress Notes (Signed)
Patient ID: Keith Nelson, male   DOB: 02-23-1930, 86 y.o.   MRN: [id number]          [facility]    Date of Admission:  12/06/2019    Day 2 linezolid        Day 2 ceftriaxone  [name] has recurrent pericardial effusion.  It appears that this was first discovered in September 2017 when he presented to [facility] with shortness of breath.  I can find no documentation that it required any specific intervention.  His pericardial effusion recurred in October 2019.  He underwent pericardial window in November 2019 with yellow, serous fluid found.  Gram stain and cultures were negative.  Several days ago he presented to [facility] again with chest pain and shortness of breath and was found to have recurrent, large pericardial effusion complicated by tamponade.  He was transferred here and taken emergently to the Cath Lab for pericardiocentesis.  850 cc: Yellow serous fluid was removed.  Gram stain showed no organisms and cultures are negative so far.  He underwent repeat pericardial window this morning with serous fluid removed.  He remains afebrile.  It is unlikely that his pericardial effusion is due to infection given the lack of systemic symptoms and the recurrent nature of his effusion.  Notes indicate that there has been concern in the past that it might be due to hypothyroidism.  If his cultures remain negative I would recommend stopping empiric antibiotic therapy tomorrow.Cliffton Asters, MD [facility] [phone] pager   [phone] cell 12/08/2019, 1:38 PM

## 2019-12-08 NOTE — Progress Notes (Signed)
Progress Note  Patient Name: Keith Nelson Date of Encounter: 12/08/2019  Primary Cardiologist: Berniece Salines, DO   Subjective   Feels "okay".  Inpatient Medications    Scheduled Meds: . chlorhexidine  15 mL Mouth Rinse BID  . Chlorhexidine Gluconate Cloth  6 each Topical Daily  . linezolid  600 mg Oral Q12H  . mouth rinse  15 mL Mouth Rinse q12n4p  . sodium chloride flush  10 mL Other Q12H   Continuous Infusions: . sodium chloride 10 mL/hr at 12/08/19 0400  . cefTRIAXone (ROCEPHIN)  IV Stopped (12/07/19 1536)   PRN Meds: sodium chloride, acetaminophen **OR** acetaminophen, acetaminophen-codeine, ondansetron **OR** ondansetron (ZOFRAN) IV   Vital Signs    Vitals:   12/08/19 0530 12/08/19 0600 12/08/19 0700 12/08/19 0748  BP:  112/63    Pulse:  68 70   Resp:  (!) 25 19   Temp:    98.1 F (36.7 C)  TempSrc:      SpO2:  93% 96%   Weight: 86.4 kg     Height:        Intake/Output Summary (Last 24 hours) at 12/08/2019 0907 Last data filed at 12/08/2019 0600 Gross per 24 hour  Intake 330.33 ml  Output 605 ml  Net -274.67 ml   Last 3 Weights 12/08/2019 12/07/2019 12/06/2019  Weight (lbs) 190 lb 7.6 oz 194 lb 3.6 oz 192 lb 3.9 oz  Weight (kg) 86.4 kg 88.1 kg 87.2 kg      Telemetry    Normal sinus rhythm- personally Reviewed  ECG      Physical Exam   GEN: No acute distress.  Able to lie at a 20 degree angle Neck: No JVD Cardiac: RRR,  Respiratory: Clear to auscultation bilaterally. GI: Soft, nontender, non-distended  MS: No edema; No deformity. Neuro:  Nonfocal  Psych: Normal affect   Labs    High Sensitivity Troponin:   Recent Labs  Lab 12/07/19 1042  TROPONINIHS 65*      Chemistry Recent Labs  Lab 12/07/19 0110  NA 135  K 5.3*  CL 99  CO2 26  GLUCOSE 142*  BUN 39*  CREATININE 2.08*  CALCIUM 8.3*  PROT 6.2*  ALBUMIN 2.7*  AST 17  ALT 11  ALKPHOS 54  BILITOT 1.3*  GFRNONAA 28*  GFRAA 32*  ANIONGAP 10     Hematology Recent  Labs  Lab 12/07/19 0110  WBC 15.4*  RBC 4.18*  HGB 11.9*  HCT 37.4*  MCV 89.5  MCH 28.5  MCHC 31.8  RDW 15.7*  PLT 181    BNPNo results for input(s): BNP, PROBNP in the last 168 hours.   DDimer No results for input(s): DDIMER in the last 168 hours.   Radiology    CT CHEST WO CONTRAST  Result Date: 12/07/2019 CLINICAL DATA:  Evaluate pleural effusion.  Malignancy suspected. EXAM: CT CHEST WITHOUT CONTRAST TECHNIQUE: Multidetector CT imaging of the chest was performed following the standard protocol without IV contrast. COMPARISON:  12/06/2019 FINDINGS: Cardiovascular: Borderline cardiomegaly. Moderate interval improvement patient's pericardial effusion with small residual pericardial effusion. A pericardial drain is in place with tip over the left posterior pericardium. Calcified plaque over the left main and 3 vessel coronary arteries. Calcified plaque over the thoracic aorta which is normal in caliber. Remaining vascular structures are unremarkable. Mediastinum/Nodes: No significant mediastinal or hilar adenopathy. Remaining mediastinal structures are unremarkable. Lungs/Pleura: Lungs are adequately inflated demonstrate interval worsening of small bilateral pleural effusions with associated basilar atelectasis. Airways are  unremarkable. Upper Abdomen: Calcified plaque over the abdominal aorta. Minimal nodular contour of the left lobe of the liver. Remainder the upper abdomen is unchanged. Musculoskeletal: Unchanged. IMPRESSION: 1. Moderate interval improvement patient's pericardial effusion post placement of pericardial drain with small residual pericardial effusion. 2. Worsening bilateral small pleural effusions with associated dependent atelectasis. 3. Aortic Atherosclerosis (ICD10-I70.0). Atherosclerotic coronary artery disease. Electronically Signed   By: Elberta Fortis M.D.   On: 12/07/2019 13:53   CARDIAC CATHETERIZATION  Result Date: 12/06/2019 Successful pericardiocentesis for cardiac  tamponade via the subxiphoid approach with removal of 850 cc of brownish straw-colored fluid sent for analysis.  Preprocedure, the patient's pressure was 93/60, pulsus paradox was present approximating 24 mmHg and there was suggestion of pulses alternations on telemetry.  Following thoracentesis pressure increased to 123/68, shortnessness of breath improved, and there was resolution of pulsus alternans on monitoring.  ECHOCARDIOGRAM LIMITED  Result Date: 12/07/2019    ECHOCARDIOGRAM LIMITED REPORT   Patient Name:   Keith Nelson Date of Exam: 12/06/2019 Medical Rec #:  [id number]           Height:       72.0 in Accession #:    [id number]          Weight:       180.0 lb Date of Birth:  10/08/1929          BSA:          2.037 m Patient Age:    86 years            BP:           96/68 mmHg Patient Gender: M                   HR:           161 bpm. Exam Location:  Inpatient Procedure: 2D Echo Indications:    Pericardiocentesis  History:        Patient has prior history of Echocardiogram examinations, most                 recent 12/06/2019. COPD, Arrythmias:Atrial Fibrillation; Risk                 Factors:Hypertension. CKD.  Sonographer:    Ross Ludwig RDCS (AE) Referring Phys: [phone] THOMAS A KELLY IMPRESSIONS  1. Limited echocardiogram for pericardiocentesis guidence. 850 mL of dark yellow serous fluid was removed with only minimal residual pericardial effusion. Repeat echocardiogram in 1-2 days. Tobias Alexander MD Electronically signed by Tobias Alexander MD Signature Date/Time: 12/07/2019/1:12:00 PM    Final    ECHOCARDIOGRAM LIMITED  Result Date: 12/06/2019    ECHOCARDIOGRAM LIMITED REPORT   Patient Name:   Keith Nelson Date of Exam: 12/06/2019 Medical Rec #:  [id number]           Height:       68.0 in Accession #:    [id number]          Weight:       180.0 lb Date of Birth:  [birth year]          BSA:          1.954 m Patient Age:    86 years            BP:           96/68 mmHg Patient Gender: M                    HR:  161 bpm. Exam Location:  Inpatient Procedure: Limited Echo, Cardiac Doppler and Color Doppler Indications:    Pericardial effusion  History:        Patient has prior history of Echocardiogram examinations, most                 recent 06/07/2018. COPD, Arrythmias:Atrial Fibrillation; Risk                 Factors:Hypertension. CKD.  Sonographer:    Ross Ludwig RDCS (AE) Referring Phys: [id number] [facility] E ARPS  Sonographer Comments: Image acquisition challenging due to COPD. IMPRESSIONS  1. An urgent pericardiocentesis or pericardial window is indicated.  2. Large circumferential pericardial effusion with maximum diameter 3 cm posteriorly. There are signs of tamponade with > 20 % respiratory variability on mitral inflow, poor RV filling, dilated IVC.  3. LVEF can't be assessed as the heart rate is 160 BPM.  4. Large pericardial effusion. The pericardial effusion is circumferential. Findings are consistent with cardiac tamponade. FINDINGS  Pericardium: A large pericardial effusion is present. The pericardial effusion is circumferential. There is excessive respiratory variation in the mitral valve spectral Doppler velocities and excessive respiratory variation in septal movement. There is evidence of cardiac tamponade.  IVC IVC diam: 2.40 cm Tobias Alexander MD Electronically signed by Tobias Alexander MD Signature Date/Time: 12/06/2019/8:48:13 PM    Final     Cardiac Studies   Recurrent pericardial effusion noted by echocardiogram  Patient Profile     86 y.o. male recurrent pericardial effusion  Assessment & Plan    Plan for pericardial window today with Dr. Cliffton Asters.  Patient is hemodynamically stable.  I discussed the plan with the family who are at bedside.  They were in agreement.  For questions or updates, please contact [facility] Please consult [email or web address] for contact info under        Signed, Lance Muss, MD  12/08/2019, 9:07 AM

## 2019-12-08 NOTE — Op Note (Signed)
      [address]       [place] [address]             [phone]        12/08/19 Patient:  Keith Nelson Pre-Op Dx: Recurrent Pericardial effusion   Post-op Dx:  same Procedure: - Robotic assisted right video thoracoscopy - Pericardial window   Surgeon and Role:      * Rodrigo Mcgranahan, Eliezer Lofts, MD - Primary    * T. Asa Lente, PA-C - assisting  Anesthesia  general EBL:  minimal Blood Administration: none Specimen:  pericardium  Drains: 20 F blake chest tube in right chest Counts: correct   Indications: 86 year old male admitted with recurrent tamponade.  He has a history of a transudate of pericardial effusion that was treated with a subxiphoid pericardial window in 2019.  At the time over a liter of fluid was drained from his pericardium.  He represents with chest pain shortness of breath, and hypoxia.  Cross-sectional imaging showed a large pericardial effusion and he was taken for an emergent pericardiocentesis.  Due to the reaccumulation of fluid in the setting of a previous subxiphoid window, CVTS has been consulted to evaluate the patient for redo pericardial window.  Findings: Thickened pericardium.  Fibrinous exudate on the epicardium.  Serous pericardial effusion   Operative Technique: After the risks, benefits and alternatives were thoroughly discussed, the patient was brought to the operative theatre.  Anesthesia was induced, and the patient was then placed in a left lazy lateral decubitus position and was prepped and draped in normal sterile fashion.  An appropriate surgical pause was performed, and pre-operative antibiotics were dosed accordingly.  We began by placing our 3 robotic ports in the the intercostal spaces targeting the pericardium.  A 15mm assistant port was placed in the 9th intercostal space in the posterior axillary line.  The robot was then docked and all instruments were passed under direct visualization.    The pericardium was visualized,  and a 3X3 centimeters pericardial window was created using Bovie cautery.  The pericardium was thickened.  Immediate release of serous pericardial fluid was released.  The fluid and pericardium were sent for specimen.  A 19 [place] Blake drain was passed through right inferior robotic port into the pericardium.  An intercostal nerve block was performed under direct visualization.  The skin and soft tissue were closed with absorbable suture    The patient tolerated the procedure without any immediate complications, and was transferred to the PACU in stable condition.  [name]

## 2019-12-09 ENCOUNTER — Encounter: Payer: Self-pay | Admitting: *Deleted

## 2019-12-09 ENCOUNTER — Inpatient Hospital Stay (HOSPITAL_COMMUNITY): Payer: Medicare HMO

## 2019-12-09 DIAGNOSIS — I10 Essential (primary) hypertension: Secondary | ICD-10-CM

## 2019-12-09 DIAGNOSIS — N1832 Chronic kidney disease, stage 3b: Secondary | ICD-10-CM

## 2019-12-09 DIAGNOSIS — I313 Pericardial effusion (noninflammatory): Secondary | ICD-10-CM | POA: Diagnosis not present

## 2019-12-09 DIAGNOSIS — I480 Paroxysmal atrial fibrillation: Secondary | ICD-10-CM | POA: Diagnosis not present

## 2019-12-09 LAB — POCT I-STAT 7, (LYTES, BLD GAS, ICA,H+H)
Acid-base deficit: 2 mmol/L (ref 0.0–2.0)
Acid-base deficit: 3 mmol/L — ABNORMAL HIGH (ref 0.0–2.0)
Bicarbonate: 27.2 mmol/L (ref 20.0–28.0)
Bicarbonate: 24.9 mmol/L (ref 20.0–28.0)
Calcium, Ion: 1.2 mmol/L (ref 1.15–1.40)
Calcium, Ion: 1.15 mmol/L (ref 1.15–1.40)
HCT: 34 % — ABNORMAL LOW (ref 39.0–52.0)
HCT: 34 % — ABNORMAL LOW (ref 39.0–52.0)
Hemoglobin: 11.6 g/dL — ABNORMAL LOW (ref 13.0–17.0)
Hemoglobin: 11.6 g/dL — ABNORMAL LOW (ref 13.0–17.0)
O2 Saturation: 93 %
O2 Saturation: 92 %
Patient temperature: 98
Patient temperature: 97.8
Potassium: 5.4 mmol/L — ABNORMAL HIGH (ref 3.5–5.1)
Potassium: 5 mmol/L (ref 3.5–5.1)
Sodium: 131 mmol/L — ABNORMAL LOW (ref 135–145)
Sodium: 134 mmol/L — ABNORMAL LOW (ref 135–145)
TCO2: 29 mmol/L (ref 22–32)
TCO2: 27 mmol/L (ref 22–32)
pCO2 arterial: 67.1 mmHg (ref 32.0–48.0)
pCO2 arterial: 55.1 mmHg — ABNORMAL HIGH (ref 32.0–48.0)
pH, Arterial: 7.213 — ABNORMAL LOW (ref 7.350–7.450)
pH, Arterial: 7.261 — ABNORMAL LOW (ref 7.350–7.450)
pO2, Arterial: 82 mmHg — ABNORMAL LOW (ref 83.0–108.0)
pO2, Arterial: 73 mmHg — ABNORMAL LOW (ref 83.0–108.0)

## 2019-12-09 LAB — BASIC METABOLIC PANEL
Anion gap: 12 (ref 5–15)
BUN: 46 mg/dL — ABNORMAL HIGH (ref 8–23)
CO2: 23 mmol/L (ref 22–32)
Calcium: 8.5 mg/dL — ABNORMAL LOW (ref 8.9–10.3)
Chloride: 98 mmol/L (ref 98–111)
Creatinine, Ser: 2.13 mg/dL — ABNORMAL HIGH (ref 0.61–1.24)
GFR calc Af Amer: 31 mL/min — ABNORMAL LOW (ref >60)
GFR calc non Af Amer: 27 mL/min — ABNORMAL LOW (ref >60)
Glucose, Bld: 161 mg/dL — ABNORMAL HIGH (ref 70–99)
Potassium: 5.7 mmol/L — ABNORMAL HIGH (ref 3.5–5.1)
Sodium: 133 mmol/L — ABNORMAL LOW (ref 135–145)

## 2019-12-09 LAB — CBC
HCT: 36.8 % — ABNORMAL LOW (ref 39.0–52.0)
Hemoglobin: 11.2 g/dL — ABNORMAL LOW (ref 13.0–17.0)
MCH: 28.7 pg (ref 26.0–34.0)
MCHC: 30.4 g/dL (ref 30.0–36.0)
MCV: 94.4 fL (ref 80.0–100.0)
Platelets: 218 10*3/uL (ref 150–400)
RBC: 3.9 MIL/uL — ABNORMAL LOW (ref 4.22–5.81)
RDW: 15.6 % — ABNORMAL HIGH (ref 11.5–15.5)
WBC: 10 10*3/uL (ref 4.0–10.5)
nRBC: 0 % (ref 0.0–0.2)

## 2019-12-09 LAB — SURGICAL PATHOLOGY

## 2019-12-09 LAB — GLUCOSE, CAPILLARY
Glucose-Capillary: 132 mg/dL — ABNORMAL HIGH (ref 70–99)
Glucose-Capillary: 147 mg/dL — ABNORMAL HIGH (ref 70–99)
Glucose-Capillary: 132 mg/dL — ABNORMAL HIGH (ref 70–99)
Glucose-Capillary: 118 mg/dL — ABNORMAL HIGH (ref 70–99)
Glucose-Capillary: 118 mg/dL — ABNORMAL HIGH (ref 70–99)

## 2019-12-09 LAB — SAR COV2 SEROLOGY (COVID19)AB(IGG),IA: SARS-CoV-2 Ab, IgG: NONREACTIVE

## 2019-12-09 MED ORDER — LIDOCAINE HCL (PF) 1 % IJ SOLN
INTRAMUSCULAR | Status: DC | PRN
  Administered 2019-12-09: 5 mL

## 2019-12-09 MED ORDER — DILTIAZEM HCL-DEXTROSE 125-5 MG/125ML-% IV SOLN (PREMIX)
5.0000 mg/h | INTRAVENOUS | Status: DC
  Administered 2019-12-10: 5 mg/h via INTRAVENOUS
  Filled 2019-12-09: qty 125

## 2019-12-09 MED ORDER — DILTIAZEM LOAD VIA INFUSION
10.0000 mg | Freq: Once | INTRAVENOUS | Status: DC
  Filled 2019-12-09: qty 10

## 2019-12-09 MED ORDER — LIDOCAINE HCL 1 % IJ SOLN
INTRAMUSCULAR | Status: AC
  Filled 2019-12-09: qty 20

## 2019-12-09 MED ORDER — SODIUM ZIRCONIUM CYCLOSILICATE 5 G PO PACK
5.0000 g | PACK | Freq: Once | ORAL | Status: AC
  Administered 2019-12-09: 12:00:00 5 g via ORAL
  Filled 2019-12-09: qty 1

## 2019-12-09 NOTE — Progress Notes (Signed)
[facility]  Date of Admission:  12/06/2019     Total days of antibiotics 2         ASSESSMENT:  Mr. Schetter is POD 1 from robot assisted thoracoscopy pericardial window for pericardial effusion. Continues to remain afebrile and blood cultures and pericardiocentesis specimen are without growth to date. There does not appear to be any infectious cause at present and recommend monitoring off antibiotics at this time. Pericardial window and effusion management per Cardiology and CVTS.   PLAN:  1. Recommend monitoring off antibiotics with no significant source of infection at present. 2. Monitor cultures for any identification of pathogen if present.  3. Pericardial effusion and wound management per Cardiology and CVTS.    Principal Problem:   Pericardial effusion Active Problems:   Essential hypertension   COPD (chronic obstructive pulmonary disease) (HCC)   Pericardial effusion with cardiac tamponade   Paroxysmal atrial fibrillation with RVR (HCC)   Hypothyroidism   CKD (chronic kidney disease), stage III   Acute on chronic respiratory failure with hypoxia (HCC)   . acetaminophen  1,000 mg Oral Q6H   Or  . acetaminophen (TYLENOL) oral liquid 160 mg/5 mL  1,000 mg Oral Q6H  . bisacodyl  10 mg Oral Daily  . Chlorhexidine Gluconate Cloth  6 each Topical Daily  . insulin aspart  0-24 Units Subcutaneous TID AC & HS  . levothyroxine  112 mcg Oral Q0600  . mouth rinse  15 mL Mouth Rinse BID  . senna-docusate  1 tablet Oral QHS  . sodium chloride flush  10-40 mL Intracatheter Q12H  . sodium zirconium cyclosilicate  5 g Oral Once  . tamsulosin  0.4 mg Oral Daily    SUBJECTIVE:  Afebrile overnight with no acute events. Feeling better today with some nausea. Breathing easier.   No Known Allergies   Review of Systems: Review of Systems  Constitutional: Negative for chills, fever and weight loss.  Respiratory: Negative for cough, shortness of breath  and wheezing.   Cardiovascular: Negative for chest pain and leg swelling.  Gastrointestinal: Negative for abdominal pain, constipation, diarrhea, nausea and vomiting.  Skin: Negative for rash.      OBJECTIVE: Vitals:   12/09/19 0800 12/09/19 0803 12/09/19 0900 12/09/19 1000  BP: 130/61 130/61 128/64 121/65  Pulse: 71 66 65 70  Resp: (!) 24 18 14 13   Temp:      TempSrc:      SpO2: 96% 95% 95% 93%  Weight:      Height:       Body mass index is 25.83 kg/m.  Physical Exam Constitutional:      General: He is not in acute distress.    Appearance: He is well-developed.  Cardiovascular:     Rate and Rhythm: Normal rate and regular rhythm.     Heart sounds: Normal heart sounds.  Pulmonary:     Effort: Pulmonary effort is normal.     Breath sounds: Normal breath sounds.  Skin:    General: Skin is warm and dry.  Neurological:     Mental Status: He is alert and oriented to person, place, and time.  Psychiatric:        Behavior: Behavior normal.        Thought Content: Thought content normal.        Judgment: Judgment normal.     Lab Results Lab Results  Component Value Date   WBC 10.0 12/09/2019   HGB 11.6 (L) 12/09/2019  HCT 34.0 (L) 12/09/2019   MCV 94.4 12/09/2019   PLT 218 12/09/2019    Lab Results  Component Value Date   CREATININE 2.13 (H) 12/09/2019   BUN 46 (H) 12/09/2019   NA 134 (L) 12/09/2019   K 5.0 12/09/2019   CL 98 12/09/2019   CO2 23 12/09/2019    Lab Results  Component Value Date   ALT 11 12/07/2019   AST 17 12/07/2019   ALKPHOS 54 12/07/2019   BILITOT 1.3 (H) 12/07/2019     Microbiology: Recent Results (from the past 240 hour(s))  Surgical PCR screen     Status: None   Collection Time: 12/06/19  9:18 PM   Specimen: Nasal Mucosa; Nasal Swab  Result Value Ref Range Status   MRSA, PCR NEGATIVE NEGATIVE Final   Staphylococcus aureus NEGATIVE NEGATIVE Final    Comment: (NOTE) The Xpert SA Assay (FDA approved for NASAL specimens in  patients 71 years of age and older), is one component of a comprehensive surveillance program. It is not intended to diagnose infection nor to guide or monitor treatment. Performed at [facility], [address], [place], [place] [address]   Body fluid culture     Status: None (Preliminary result)   Collection Time: 12/06/19  9:52 PM   Specimen: PATH Cytology Misc. fluid; Body Fluid  Result Value Ref Range Status   Specimen Description FLUID  Final   Special Requests PERICARDIOCENTESIS SYRINGE  Final   Gram Stain   Final    MODERATE WBC PRESENT, PREDOMINANTLY PMN NO ORGANISMS SEEN    Culture   Final    NO GROWTH 3 DAYS Performed at [facility], [address], [place], [place] [address]    Report Status PENDING  Incomplete  Culture, blood (routine x 2)     Status: None (Preliminary result)   Collection Time: 12/07/19  3:44 AM   Specimen: BLOOD RIGHT HAND  Result Value Ref Range Status   Specimen Description BLOOD RIGHT HAND  Final   Special Requests   Final    BOTTLES DRAWN AEROBIC AND ANAEROBIC Blood Culture adequate volume   Culture   Final    NO GROWTH 2 DAYS Performed at [facility], [address], [place], [place] [address]    Report Status PENDING  Incomplete  Culture, blood (routine x 2)     Status: None (Preliminary result)   Collection Time: 12/07/19  3:51 AM   Specimen: BLOOD LEFT HAND  Result Value Ref Range Status   Specimen Description BLOOD LEFT HAND  Final   Special Requests   Final    BOTTLES DRAWN AEROBIC ONLY Blood Culture adequate volume   Culture   Final    NO GROWTH 2 DAYS Performed at [facility], [address], [place], [place] [address]    Report Status PENDING  Incomplete  Respiratory Panel by RT PCR (Flu A&B, Covid) - Nasopharyngeal Swab     Status: None   Collection Time: 12/08/19  2:16 PM   Specimen: Nasopharyngeal Swab  Result Value Ref Range Status   SARS Coronavirus 2 by RT PCR NEGATIVE NEGATIVE Final     Comment: (NOTE) SARS-CoV-2 target nucleic acids are NOT DETECTED. The SARS-CoV-2 RNA is generally detectable in upper respiratoy specimens during the acute phase of infection. The lowest concentration of SARS-CoV-2 viral copies this assay can detect is 131 copies/mL. A negative result does not preclude SARS-Cov-2 infection and should not be used as  the sole basis for treatment or other patient management decisions. A negative result may occur with  improper specimen collection/handling, submission of specimen other than nasopharyngeal swab, presence of viral mutation(s) within the areas targeted by this assay, and inadequate number of viral copies (<131 copies/mL). A negative result must be combined with clinical observations, patient history, and epidemiological information. The expected result is Negative. Fact Sheet for Patients:  PinkCheek.be Fact Sheet for Healthcare Providers:  GravelBags.it This test is not yet ap proved or cleared by the [place] FDA and  has been authorized for detection and/or diagnosis of SARS-CoV-2 by FDA under an Emergency Use Authorization (EUA). This EUA will remain  in effect (meaning this test can be used) for the duration of the COVID-19 declaration under Section 564(b)(1) of the Act, 21 U.S.C. section 360bbb-3(b)(1), unless the authorization is terminated or revoked sooner.    Influenza A by PCR NEGATIVE NEGATIVE Final   Influenza B by PCR NEGATIVE NEGATIVE Final    Comment: (NOTE) The Xpert Xpress SARS-CoV-2/FLU/RSV assay is intended as an aid in  the diagnosis of influenza from Nasopharyngeal swab specimens and  should not be used as a sole basis for treatment. Nasal washings and  aspirates are unacceptable for Xpert Xpress SARS-CoV-2/FLU/RSV  testing. Fact Sheet for Patients: PinkCheek.be Fact Sheet for Healthcare  Providers: GravelBags.it This test is not yet approved or cleared by the [place] FDA and  has been authorized for detection and/or diagnosis of SARS-CoV-2 by  FDA under an Emergency Use Authorization (EUA). This EUA will remain  in effect (meaning this test can be used) for the duration of the  Covid-19 declaration under Section 564(b)(1) of the Act, 21  U.S.C. section 360bbb-3(b)(1), unless the authorization is  terminated or revoked. Performed at [facility], [place] [address] [place], [place] [address]      Terri Piedra, [place]  12/09/2019  11:56 AM

## 2019-12-09 NOTE — Progress Notes (Addendum)
[address]       [place] [address]             [phone]      1 Day Post-Op Procedure(s) (LRB): XI ROBOTIC ASSISTED THORACOSCOPY PERICARDIAL WINDOW (Right) Subjective: Says his breathing is more comfortable  Objective: Vital signs in last 24 hours: Temp:  [97.7 F (36.5 C)-98 F (36.7 C)] 97.8 F (36.6 C) (05/04 0350) Pulse Rate:  [59-88] 60 (05/04 0700) Cardiac Rhythm: Sinus bradycardia (05/04 0400) Resp:  [10-23] 15 (05/04 0700) BP: (110-159)/(51-79) 115/66 (05/04 0700) SpO2:  [89 %-97 %] 95 % (05/04 0700) Arterial Line BP: (111-175)/(45-70) 133/56 (05/04 0700) FiO2 (%):  [70 %] 70 % (05/04 0502)  Hemodynamic parameters for last 24 hours:    Intake/Output from previous day: 05/03 0701 - 05/04 0700 In: 1651.4 [P.O.:210; I.V.:1341.4; IV Piggyback:100] Out: 1145 [Urine:655; Chest Tube:490] Intake/Output this shift: No intake/output data recorded.  General appearance: alert, cooperative, fatigued and no distress Heart: regular rate and rhythm and no rub Lungs: dim left> right lower lung fields Abdomen: soft, non-tender Extremities: PAS in place Wound: dressings clean  Lab Results: Recent Labs    12/07/19 0110 12/09/19 0335 12/09/19 0438 12/09/19 0608  WBC 15.4*  --  10.0  --   HGB 11.9*   < > 11.2* 11.6*  HCT 37.4*   < > 36.8* 34.0*  PLT 181  --  218  --    < > = values in this interval not displayed.   BMET:  Recent Labs    12/07/19 0110 12/09/19 0335 12/09/19 0438 12/09/19 0608  NA 135   < > 133* 134*  K 5.3*   < > 5.7* 5.0  CL 99  --  98  --   CO2 26  --  23  --   GLUCOSE 142*  --  161*  --   BUN 39*  --  46*  --   CREATININE 2.08*  --  2.13*  --   CALCIUM 8.3*  --  8.5*  --    < > = values in this interval not displayed.    PT/INR:  Recent Labs    12/07/19 0110  LABPROT 18.1*  INR 1.6*   ABG    Component Value Date/Time   PHART 7.261 (L) 12/09/2019 0608   HCO3 24.9 12/09/2019 0608   TCO2 27 12/09/2019  0608   ACIDBASEDEF 3.0 (H) 12/09/2019 0608   O2SAT 92.0 12/09/2019 0608   CBG (last 3)  Recent Labs    12/08/19 2124 12/09/19 0641 12/09/19 0728  GLUCAP 148* 147* 132*    Meds Scheduled Meds: . acetaminophen  1,000 mg Oral Q6H   Or  . acetaminophen (TYLENOL) oral liquid 160 mg/5 mL  1,000 mg Oral Q6H  . bisacodyl  10 mg Oral Daily  . Chlorhexidine Gluconate Cloth  6 each Topical Daily  . insulin aspart  0-24 Units Subcutaneous TID AC & HS  . levothyroxine  112 mcg Oral Q0600  . mouth rinse  15 mL Mouth Rinse BID  . senna-docusate  1 tablet Oral QHS  . sodium chloride flush  10-40 mL Intracatheter Q12H  . tamsulosin  0.4 mg Oral Daily   Continuous Infusions: . sodium chloride 10 mL/hr at 12/09/19 0700  . cefTRIAXone (ROCEPHIN)  IV Stopped (12/08/19 1346)   PRN Meds:.Place/Maintain arterial line **AND** sodium chloride, ondansetron (ZOFRAN) IV, sodium chloride flush, traMADol  Xrays CT CHEST WO CONTRAST  Result Date:  12/07/2019 CLINICAL DATA:  Evaluate pleural effusion.  Malignancy suspected. EXAM: CT CHEST WITHOUT CONTRAST TECHNIQUE: Multidetector CT imaging of the chest was performed following the standard protocol without IV contrast. COMPARISON:  12/06/2019 FINDINGS: Cardiovascular: Borderline cardiomegaly. Moderate interval improvement patient's pericardial effusion with small residual pericardial effusion. A pericardial drain is in place with tip over the left posterior pericardium. Calcified plaque over the left main and 3 vessel coronary arteries. Calcified plaque over the thoracic aorta which is normal in caliber. Remaining vascular structures are unremarkable. Mediastinum/Nodes: No significant mediastinal or hilar adenopathy. Remaining mediastinal structures are unremarkable. Lungs/Pleura: Lungs are adequately inflated demonstrate interval worsening of small bilateral pleural effusions with associated basilar atelectasis. Airways are unremarkable. Upper Abdomen: Calcified  plaque over the abdominal aorta. Minimal nodular contour of the left lobe of the liver. Remainder the upper abdomen is unchanged. Musculoskeletal: Unchanged. IMPRESSION: 1. Moderate interval improvement patient's pericardial effusion post placement of pericardial drain with small residual pericardial effusion. 2. Worsening bilateral small pleural effusions with associated dependent atelectasis. 3. Aortic Atherosclerosis (ICD10-I70.0). Atherosclerotic coronary artery disease. Electronically Signed   By: Elberta Fortis M.D.   On: 12/07/2019 13:53   DG Chest Port 1 View  Result Date: 12/08/2019 CLINICAL DATA:  Pericardial effusion EXAM: PORTABLE CHEST 1 VIEW COMPARISON:  CT chest 12/07/2019 FINDINGS: Right jugular central venous catheter with the tip projecting over the SVC. Pericardial drain is again noted. Small bilateral pleural effusions, left greater than right. Bilateral mild interstitial thickening. No pneumothorax. Stable cardiomegaly. Thoracic aortic atherosclerosis. No acute osseous abnormality. IMPRESSION: 1. Right jugular central venous catheter with the tip projecting over the SVC. Pericardial drain is again noted. 2. Small bilateral pleural effusions, left greater than right. 3. Stable cardiomegaly with pulmonary vascular congestion. Electronically Signed   By: Elige Ko   On: 12/08/2019 13:31    Assessment/Plan: S/P Procedure(s) (LRB): XI ROBOTIC ASSISTED THORACOSCOPY PERICARDIAL WINDOW (Right)  1 hemodyn stable with some HTN 3 sats ok on BiPap, now HFNC 3 CT 490 cc- no air leak, cont for now 4 CXR shows large left effusion, some vasc congestion 5 medical management as per primary/consultants  LOS: 3 days    Rowe Clack PA-C Pager [phone] 12/09/2019  Agree with above 490 cc removed. On chest x-ray he has an enlarging left effusion.  Ultrasound-guided thoracentesis has been ordered.  Kelvyn Schunk Keane Scrape

## 2019-12-09 NOTE — Progress Notes (Signed)
Progress Note  Patient Name: Keith Nelson Date of Encounter: 12/09/2019  Primary Cardiologist: Thomasene Ripple, DO   Subjective   Breathing feels better today.  He is still requiring high-dose oxygen.  Inpatient Medications    Scheduled Meds: . acetaminophen  1,000 mg Oral Q6H   Or  . acetaminophen (TYLENOL) oral liquid 160 mg/5 mL  1,000 mg Oral Q6H  . bisacodyl  10 mg Oral Daily  . Chlorhexidine Gluconate Cloth  6 each Topical Daily  . insulin aspart  0-24 Units Subcutaneous TID AC & HS  . levothyroxine  112 mcg Oral Q0600  . mouth rinse  15 mL Mouth Rinse BID  . senna-docusate  1 tablet Oral QHS  . sodium chloride flush  10-40 mL Intracatheter Q12H  . sodium zirconium cyclosilicate  5 g Oral Once  . tamsulosin  0.4 mg Oral Daily   Continuous Infusions: . sodium chloride Stopped (12/09/19 0933)   PRN Meds: Place/Maintain arterial line **AND** sodium chloride, ondansetron (ZOFRAN) IV, sodium chloride flush, traMADol   Vital Signs    Vitals:   12/09/19 0800 12/09/19 0803 12/09/19 0900 12/09/19 1000  BP: 130/61 130/61 128/64 121/65  Pulse: 71 66 65 70  Resp: (!) 24 18 14 13   Temp:      TempSrc:      SpO2: 96% 95% 95% 93%  Weight:      Height:        Intake/Output Summary (Last 24 hours) at 12/09/2019 1100 Last data filed at 12/09/2019 1000 Gross per 24 hour  Intake 2226.9 ml  Output 1145 ml  Net 1081.9 ml   Last 3 Weights 12/09/2019 12/08/2019 12/07/2019  Weight (lbs) (No Data) 190 lb 7.6 oz 194 lb 3.6 oz  Weight (kg) (No Data) 86.4 kg 88.1 kg      Telemetry    Atrial fibrillation- Personally Reviewed  ECG    Atrial fibrillation, nonspecific ST changes- Personally Reviewed  Physical Exam   GEN: No acute distress.   Neck: No JVD Cardiac:  Irregularly irregular, no murmurs, rubs, or gallops.  Respiratory:  Decreased breath sounds on the left. GI: Soft, nontender, non-distended  MS: No edema; No deformity.  Chest tube in place Neuro:  Nonfocal    Psych: Normal affect   Labs    High Sensitivity Troponin:   Recent Labs  Lab 12/07/19 1042  TROPONINIHS 65*      Chemistry Recent Labs  Lab 12/07/19 0110 12/07/19 0110 12/09/19 0335 12/09/19 0438 12/09/19 0608  NA 135   < > 131* 133* 134*  K 5.3*   < > 5.4* 5.7* 5.0  CL 99  --   --  98  --   CO2 26  --   --  23  --   GLUCOSE 142*  --   --  161*  --   BUN 39*  --   --  46*  --   CREATININE 2.08*  --   --  2.13*  --   CALCIUM 8.3*  --   --  8.5*  --   PROT 6.2*  --   --   --   --   ALBUMIN 2.7*  --   --   --   --   AST 17  --   --   --   --   ALT 11  --   --   --   --   ALKPHOS 54  --   --   --   --  BILITOT 1.3*  --   --   --   --   GFRNONAA 28*  --   --  27*  --   GFRAA 32*  --   --  31*  --   ANIONGAP 10  --   --  12  --    < > = values in this interval not displayed.     Hematology Recent Labs  Lab 12/07/19 0110 12/07/19 0110 12/09/19 0335 12/09/19 0438 12/09/19 0608  WBC 15.4*  --   --  10.0  --   RBC 4.18*  --   --  3.90*  --   HGB 11.9*   < > 11.6* 11.2* 11.6*  HCT 37.4*   < > 34.0* 36.8* 34.0*  MCV 89.5  --   --  94.4  --   MCH 28.5  --   --  28.7  --   MCHC 31.8  --   --  30.4  --   RDW 15.7*  --   --  15.6*  --   PLT 181  --   --  218  --    < > = values in this interval not displayed.    BNPNo results for input(s): BNP, PROBNP in the last 168 hours.   DDimer No results for input(s): DDIMER in the last 168 hours.   Radiology    DG Chest 1 View  Result Date: 12/09/2019 CLINICAL DATA:  Shortness of breath. EXAM: CHEST  1 VIEW COMPARISON:  Dec 08, 2019. FINDINGS: Stable cardiomegaly. Right internal jugular catheter is unchanged in position. Pericardial drain is again noted and unchanged. Moderate left pleural effusion is noted with underlying atelectasis or infiltrate. Bony thorax is unremarkable. IMPRESSION: Stable moderate left pleural effusion with underlying atelectasis or infiltrate. Stable pericardial drain is noted. Electronically Signed    By: Marijo Conception M.D.   On: 12/09/2019 08:50   CT CHEST WO CONTRAST  Result Date: 12/07/2019 CLINICAL DATA:  Evaluate pleural effusion.  Malignancy suspected. EXAM: CT CHEST WITHOUT CONTRAST TECHNIQUE: Multidetector CT imaging of the chest was performed following the standard protocol without IV contrast. COMPARISON:  12/06/2019 FINDINGS: Cardiovascular: Borderline cardiomegaly. Moderate interval improvement patient's pericardial effusion with small residual pericardial effusion. A pericardial drain is in place with tip over the left posterior pericardium. Calcified plaque over the left main and 3 vessel coronary arteries. Calcified plaque over the thoracic aorta which is normal in caliber. Remaining vascular structures are unremarkable. Mediastinum/Nodes: No significant mediastinal or hilar adenopathy. Remaining mediastinal structures are unremarkable. Lungs/Pleura: Lungs are adequately inflated demonstrate interval worsening of small bilateral pleural effusions with associated basilar atelectasis. Airways are unremarkable. Upper Abdomen: Calcified plaque over the abdominal aorta. Minimal nodular contour of the left lobe of the liver. Remainder the upper abdomen is unchanged. Musculoskeletal: Unchanged. IMPRESSION: 1. Moderate interval improvement patient's pericardial effusion post placement of pericardial drain with small residual pericardial effusion. 2. Worsening bilateral small pleural effusions with associated dependent atelectasis. 3. Aortic Atherosclerosis (ICD10-I70.0). Atherosclerotic coronary artery disease. Electronically Signed   By: Marin Olp M.D.   On: 12/07/2019 13:53   DG Chest Port 1 View  Result Date: 12/08/2019 CLINICAL DATA:  Pericardial effusion EXAM: PORTABLE CHEST 1 VIEW COMPARISON:  CT chest 12/07/2019 FINDINGS: Right jugular central venous catheter with the tip projecting over the SVC. Pericardial drain is again noted. Small bilateral pleural effusions, left greater than  right. Bilateral mild interstitial thickening. No pneumothorax. Stable cardiomegaly. Thoracic aortic atherosclerosis. No acute osseous  abnormality. IMPRESSION: 1. Right jugular central venous catheter with the tip projecting over the SVC. Pericardial drain is again noted. 2. Small bilateral pleural effusions, left greater than right. 3. Stable cardiomegaly with pulmonary vascular congestion. Electronically Signed   By: Elige Ko   On: 12/08/2019 13:31    Cardiac Studies   EF 53% at [facility]; I personally reviewed the chest x-ray showing a moderate sized left  Pleural effusion.  Patient Profile     86 y.o. male with evidence of worsening renal function compared to 2019, recurrent pericardial effusion, left-sided pleural effusion  Assessment & Plan    Pericardial effusion: Status post window performed yesterday.  Drain still in place.  Pleural effusion: He has a high oxygen requirement.  Given his renal dysfunction, I do not want to diurese him.  Giving him fluid for his renal dysfunction may actually make the effusions worse.  I spoke with Dr. Cliffton Asters.  He has put in a referral for interventional radiology to tap the left-sided effusion.  Hopefully, this will help improve his oxygen requirement.  Avoid nephrotoxins given the renal dysfunction.  No family at bedside.  I called the daughter, Fannie Knee, but there was no answer.  Will try again after there is an update from IR.   For questions or updates, please contact [facility] Please consult [email or web address] for contact info under        Signed, Lance Muss, MD  12/09/2019, 11:00 AM

## 2019-12-09 NOTE — Plan of Care (Signed)
  Problem: Clinical Measurements: Goal: Respiratory complications will improve Outcome: Progressing   Problem: Nutrition: Goal: Adequate nutrition will be maintained Outcome: Progressing   Problem: Elimination: Goal: Will not experience complications related to bowel motility Outcome: Progressing   

## 2019-12-09 NOTE — Plan of Care (Signed)
  Problem: Education: Goal: Knowledge of General Education information will improve Description: Including pain rating scale, medication(s)/side effects and non-pharmacologic comfort measures Outcome: Progressing   Problem: Health Behavior/Discharge Planning: Goal: Ability to manage health-related needs will improve Outcome: Progressing   Problem: Clinical Measurements: Goal: Will remain free from infection Outcome: Progressing Goal: Cardiovascular complication will be avoided Outcome: Progressing   Problem: Coping: Goal: Level of anxiety will decrease Outcome: Progressing   Problem: Elimination: Goal: Will not experience complications related to bowel motility Outcome: Progressing Goal: Will not experience complications related to urinary retention Outcome: Progressing   Problem: Pain Managment: Goal: General experience of comfort will improve Outcome: Progressing   Problem: Safety: Goal: Ability to remain free from injury will improve Outcome: Progressing   Problem: Education: Goal: Understanding of CV disease, CV risk reduction, and recovery process will improve Outcome: Progressing

## 2019-12-09 NOTE — Progress Notes (Signed)
RT placed patient on BIPAP per MD order and ABG results. Patient tolerating BIPAP settings well at this time. Will obtain ABG in 1 hour.

## 2019-12-09 NOTE — Progress Notes (Addendum)
   Notified by RN that patient went into atrial fibrillation with RVR around 3:45pm. Telemetry reviewed and EKG at bedside confirm atrial fibrillation with RVR with rates up to 130s. Patient is unaware of his atrial fibrillation. No complaints of chest pain, SOB, or palpitations at this time. BP is stable. Echo at [facility] 12/06/19 with EF 50-55%.   Will start a diltiazem gtt for rate control. Will continue to hold off on anticoagulation at this time given recent pericardial window.   Plan discussed with Dr. Eldridge Dace at bedside.  Will continue to monitor.  Beatriz Stallion, PA-C 12/09/19; 4:07 PM   Reviewed IR u/s report.  D/w Dr. Cliffton Asters.  Patient will need aggressive pulmonary toilet for his left lung consolidation.    Will have to sort out when anticoagulation can be restarted.   Corky Crafts, MD

## 2019-12-09 NOTE — Progress Notes (Signed)
Patient resting comfortably on 10L [place] HFNC. No respiratory distress noted. BIPAP not needed at this time. RT will monitor as needed.

## 2019-12-10 ENCOUNTER — Inpatient Hospital Stay (HOSPITAL_COMMUNITY): Payer: Medicare HMO

## 2019-12-10 DIAGNOSIS — I10 Essential (primary) hypertension: Secondary | ICD-10-CM | POA: Diagnosis not present

## 2019-12-10 DIAGNOSIS — I480 Paroxysmal atrial fibrillation: Secondary | ICD-10-CM | POA: Diagnosis not present

## 2019-12-10 DIAGNOSIS — N1832 Chronic kidney disease, stage 3b: Secondary | ICD-10-CM | POA: Diagnosis not present

## 2019-12-10 DIAGNOSIS — I313 Pericardial effusion (noninflammatory): Secondary | ICD-10-CM | POA: Diagnosis not present

## 2019-12-10 LAB — COMPREHENSIVE METABOLIC PANEL
ALT: 21 U/L (ref 0–44)
AST: 27 U/L (ref 15–41)
Albumin: 2.6 g/dL — ABNORMAL LOW (ref 3.5–5.0)
Alkaline Phosphatase: 69 U/L (ref 38–126)
Anion gap: 9 (ref 5–15)
BUN: 45 mg/dL — ABNORMAL HIGH (ref 8–23)
CO2: 28 mmol/L (ref 22–32)
Calcium: 8.7 mg/dL — ABNORMAL LOW (ref 8.9–10.3)
Chloride: 94 mmol/L — ABNORMAL LOW (ref 98–111)
Creatinine, Ser: 1.95 mg/dL — ABNORMAL HIGH (ref 0.61–1.24)
GFR calc Af Amer: 35 mL/min — ABNORMAL LOW (ref >60)
GFR calc non Af Amer: 30 mL/min — ABNORMAL LOW (ref >60)
Glucose, Bld: 108 mg/dL — ABNORMAL HIGH (ref 70–99)
Potassium: 5.1 mmol/L (ref 3.5–5.1)
Sodium: 131 mmol/L — ABNORMAL LOW (ref 135–145)
Total Bilirubin: 0.7 mg/dL (ref 0.3–1.2)
Total Protein: 6.5 g/dL (ref 6.5–8.1)

## 2019-12-10 LAB — CBC
HCT: 37.7 % — ABNORMAL LOW (ref 39.0–52.0)
Hemoglobin: 11.5 g/dL — ABNORMAL LOW (ref 13.0–17.0)
MCH: 28.4 pg (ref 26.0–34.0)
MCHC: 30.5 g/dL (ref 30.0–36.0)
MCV: 93.1 fL (ref 80.0–100.0)
Platelets: 255 10*3/uL (ref 150–400)
RBC: 4.05 MIL/uL — ABNORMAL LOW (ref 4.22–5.81)
RDW: 15.2 % (ref 11.5–15.5)
WBC: 10.9 10*3/uL — ABNORMAL HIGH (ref 4.0–10.5)
nRBC: 0 % (ref 0.0–0.2)

## 2019-12-10 LAB — BODY FLUID CULTURE: Culture: NO GROWTH

## 2019-12-10 LAB — PROTIME-INR
INR: 1.1 (ref 0.8–1.2)
Prothrombin Time: 14.1 seconds (ref 11.4–15.2)

## 2019-12-10 LAB — HEPARIN LEVEL (UNFRACTIONATED): Heparin Unfractionated: 0.31 IU/mL (ref 0.30–0.70)

## 2019-12-10 LAB — GLUCOSE, CAPILLARY
Glucose-Capillary: 116 mg/dL — ABNORMAL HIGH (ref 70–99)
Glucose-Capillary: 105 mg/dL — ABNORMAL HIGH (ref 70–99)
Glucose-Capillary: 131 mg/dL — ABNORMAL HIGH (ref 70–99)
Glucose-Capillary: 114 mg/dL — ABNORMAL HIGH (ref 70–99)

## 2019-12-10 LAB — APTT: aPTT: 64 seconds — ABNORMAL HIGH (ref 24–36)

## 2019-12-10 LAB — BLASTOMYCES ANTIGEN: Blastomyces Antigen: NOT DETECTED ng/mL

## 2019-12-10 MED ORDER — FUROSEMIDE 10 MG/ML IJ SOLN
40.0000 mg | Freq: Once | INTRAMUSCULAR | Status: AC
  Administered 2019-12-10: 09:00:00 40 mg via INTRAVENOUS
  Filled 2019-12-10: qty 4

## 2019-12-10 MED ORDER — HEPARIN (PORCINE) 25000 UT/250ML-% IV SOLN
1150.0000 [IU]/h | INTRAVENOUS | Status: AC
  Administered 2019-12-10 – 2019-12-12 (×3): 1150 [IU]/h via INTRAVENOUS
  Filled 2019-12-10 (×3): qty 250

## 2019-12-10 MED ORDER — DILTIAZEM HCL 60 MG PO TABS
30.0000 mg | ORAL_TABLET | Freq: Four times a day (QID) | ORAL | Status: DC
  Administered 2019-12-10 – 2019-12-12 (×10): 30 mg via ORAL
  Filled 2019-12-10 (×9): qty 1

## 2019-12-10 NOTE — Progress Notes (Signed)
ANTICOAGULATION CONSULT NOTE - Initial Consult  Pharmacy Consult for heparin IV Indication: atrial fibrillation  No Known Allergies  Patient Measurements: Height: 6' (182.9 cm) Weight: 88.6 kg (195 lb 5.2 oz) IBW/kg (Calculated) : 77.6 Heparin Dosing Weight: 88.6 kg  Vital Signs: Temp: 98.2 F (36.8 C) (05/05 0657) Temp Source: Oral (05/05 0400) BP: 113/64 (05/05 0900) Pulse Rate: 76 (05/05 0900)  Labs: Recent Labs    12/07/19 1042 12/09/19 0335 12/09/19 0438 12/09/19 0438 12/09/19 0608 12/10/19 0425  HGB  --    < > 11.2*   < > 11.6* 11.5*  HCT  --    < > 36.8*  --  34.0* 37.7*  PLT  --   --  218  --   --  255  CREATININE  --   --  2.13*  --   --  1.95*  TROPONINIHS 65*  --   --   --   --   --    < > = values in this interval not displayed.    Estimated Creatinine Clearance: 29.3 mL/min (A) (by C-G formula based on SCr of 1.95 mg/dL (H)).   Medical History: Past Medical History:  Diagnosis Date  . Anemia   . Arthritis    "joints ache sometimes" (05/07/2018)  . CKD (chronic kidney disease), stage II    "stage II or III" (05/07/2018)  . COPD (chronic obstructive pulmonary disease) (HCC)   . High cholesterol   . History of blood transfusion 2017  . Hypertension   . Hypothyroidism   . On home oxygen therapy    "just at night" (05/07/2018)  . Pericardial effusion 05/07/2018  . Pneumonia 2009; ~ 2017; 02/2018    Medications:  Scheduled:  . acetaminophen  1,000 mg Oral Q6H   Or  . acetaminophen (TYLENOL) oral liquid 160 mg/5 mL  1,000 mg Oral Q6H  . bisacodyl  10 mg Oral Daily  . Chlorhexidine Gluconate Cloth  6 each Topical Daily  . diltiazem  10 mg Intravenous Once  . insulin aspart  0-24 Units Subcutaneous TID AC & HS  . levothyroxine  112 mcg Oral Q0600  . mouth rinse  15 mL Mouth Rinse BID  . senna-docusate  1 tablet Oral QHS  . sodium chloride flush  10-40 mL Intracatheter Q12H  . tamsulosin  0.4 mg Oral Daily   Infusions:  . sodium chloride  Stopped (12/09/19 0933)  . diltiazem (CARDIZEM) infusion 5 mg/hr (12/10/19 0404)  . heparin      Assessment: 86 y/o male presenting with pericardial effusion requiring pericardial window on 5/3. Most recently, patient developed new left pericardial effusion with intervention anticipated. Notably, patient has new-onset AF - not on anticoagulation PTA.   Hemoglobin is low at 11.5, but stable. Platelet count WNL. Patient is at a higher risk of bleeding due to surgical intervention. Therefore, heparin level goal will be 0.3-0.5 and we will not give a bolus.   Goal of Therapy:  HL 0.3-0.5 Monitor platelets by anticoagulation protocol: Yes   Plan:  Start heparin infusion at 1150  units/hr Check anti-Xa level in 8 hours and daily while on heparin Continue to monitor H&H and platelets  Monitor for s/sx of bleeding  Ellison Carwin, PharmD PGY1 Pharmacy Resident

## 2019-12-10 NOTE — Progress Notes (Addendum)
[address]       [place] [address]             [phone]      2 Days Post-Op Procedure(s) (LRB): XI ROBOTIC ASSISTED THORACOSCOPY PERICARDIAL WINDOW (Right) Subjective: Breathing is a little more comfortable  Objective: Vital signs in last 24 hours: Temp:  [97.8 F (36.6 C)-98.2 F (36.8 C)] 98.2 F (36.8 C) (05/05 0657) Pulse Rate:  [46-143] 79 (05/05 0805) Cardiac Rhythm: Atrial fibrillation (05/05 0400) Resp:  [10-29] 13 (05/05 0805) BP: (102-145)/(52-86) 112/60 (05/05 0805) SpO2:  [84 %-99 %] 94 % (05/05 0700) Arterial Line BP: (140)/(57) 140/57 (05/04 0900) Weight:  [88.6 kg] 88.6 kg (05/05 0500)  Hemodynamic parameters for last 24 hours:    Intake/Output from previous day: 05/04 0701 - 05/05 0700 In: 1375.5 [P.O.:1350; I.V.:25.5] Out: 1400 [Urine:1270; Chest Tube:130] Intake/Output this shift: No intake/output data recorded.  General appearance: alert, cooperative and no distress Heart: regular rate and rhythm Lungs: dim left base Abdomen: minor distension, nontender Extremities: trace edema Wound: incis healing well  Lab Results: Recent Labs    12/09/19 0438 12/09/19 0438 12/09/19 0608 12/10/19 0425  WBC 10.0  --   --  10.9*  HGB 11.2*   < > 11.6* 11.5*  HCT 36.8*   < > 34.0* 37.7*  PLT 218  --   --  255   < > = values in this interval not displayed.   BMET:  Recent Labs    12/09/19 0438 12/09/19 0438 12/09/19 0608 12/10/19 0425  NA 133*   < > 134* 131*  K 5.7*   < > 5.0 5.1  CL 98  --   --  94*  CO2 23  --   --  28  GLUCOSE 161*  --   --  108*  BUN 46*  --   --  45*  CREATININE 2.13*  --   --  1.95*  CALCIUM 8.5*  --   --  8.7*   < > = values in this interval not displayed.    PT/INR: No results for input(s): LABPROT, INR in the last 72 hours. ABG    Component Value Date/Time   PHART 7.261 (L) 12/09/2019 0608   HCO3 24.9 12/09/2019 0608   TCO2 27 12/09/2019 0608   ACIDBASEDEF 3.0 (H) 12/09/2019 0608     O2SAT 92.0 12/09/2019 0608   CBG (last 3)  Recent Labs    12/09/19 1658 12/09/19 2147 12/10/19 0652  GLUCAP 118* 118* 116*    Meds Scheduled Meds: . acetaminophen  1,000 mg Oral Q6H   Or  . acetaminophen (TYLENOL) oral liquid 160 mg/5 mL  1,000 mg Oral Q6H  . bisacodyl  10 mg Oral Daily  . Chlorhexidine Gluconate Cloth  6 each Topical Daily  . diltiazem  10 mg Intravenous Once  . insulin aspart  0-24 Units Subcutaneous TID AC & HS  . levothyroxine  112 mcg Oral Q0600  . mouth rinse  15 mL Mouth Rinse BID  . senna-docusate  1 tablet Oral QHS  . sodium chloride flush  10-40 mL Intracatheter Q12H  . tamsulosin  0.4 mg Oral Daily   Continuous Infusions: . sodium chloride Stopped (12/09/19 0933)  . diltiazem (CARDIZEM) infusion 5 mg/hr (12/10/19 0404)   PRN Meds:.Place/Maintain arterial line **AND** sodium chloride, lidocaine (PF), ondansetron (ZOFRAN) IV, sodium chloride flush, traMADol  Xrays DG Chest 1 View  Result Date: 12/09/2019 CLINICAL DATA:  Shortness  of breath. EXAM: CHEST  1 VIEW COMPARISON:  Dec 08, 2019. FINDINGS: Stable cardiomegaly. Right internal jugular catheter is unchanged in position. Pericardial drain is again noted and unchanged. Moderate left pleural effusion is noted with underlying atelectasis or infiltrate. Bony thorax is unremarkable. IMPRESSION: Stable moderate left pleural effusion with underlying atelectasis or infiltrate. Stable pericardial drain is noted. Electronically Signed   By: Marijo Conception M.D.   On: 12/09/2019 08:50   DG Chest Port 1 View  Result Date: 12/08/2019 CLINICAL DATA:  Pericardial effusion EXAM: PORTABLE CHEST 1 VIEW COMPARISON:  CT chest 12/07/2019 FINDINGS: Right jugular central venous catheter with the tip projecting over the SVC. Pericardial drain is again noted. Small bilateral pleural effusions, left greater than right. Bilateral mild interstitial thickening. No pneumothorax. Stable cardiomegaly. Thoracic aortic  atherosclerosis. No acute osseous abnormality. IMPRESSION: 1. Right jugular central venous catheter with the tip projecting over the SVC. Pericardial drain is again noted. 2. Small bilateral pleural effusions, left greater than right. 3. Stable cardiomegaly with pulmonary vascular congestion. Electronically Signed   By: Kathreen Devoid   On: 12/08/2019 13:31   IR US CHEST  Result Date: 12/09/2019 CLINICAL DATA:  Pleural effusions, assess for thoracentesis EXAM: ULTRASOUND OF CHEST SOFT TISSUES TECHNIQUE: Ultrasound examination of the chest wall soft tissues was performed in the area of clinical concern. COMPARISON:  12/09/2019 FINDINGS: Ultrasound performed of the left posterior chest. Small effusion with left lung collapse/consolidation. Not enough fluid to warrant large volume thoracentesis. Procedure not performed. IMPRESSION: Small left effusion by ultrasound. Electronically Signed   By: Jerilynn Mages.  Shick M.D.   On: 12/09/2019 15:58    Assessment/Plan: S/P Procedure(s) (LRB): XI ROBOTIC ASSISTED THORACOSCOPY PERICARDIAL WINDOW (Right)  1 SBP stable, HR variable with aflutter/fib, sinus tachy 2 sats ok on 8 HFNC 3 CT 130 cc , no air leak - keep tube for now, poss d.c later today or tomorrow 4 CXR - no pntx, no signif effus on right, left thoracentesis not performed as not enough fluid to drain, more an issue of consolidation 5 renal fxn improving trend 6 minor anemia 7 medical management as per cards   LOS: 4 days    John Giovanni PA-C Pager [phone] 12/10/2019   Afib this morning started on hep gtt 130 from CT. [place] yesterday didn't show enough fluid to tap Will continue CT till output < 191mls over 24hrs Ok for 2C/4E from a surgical standpoint  Lajuana Matte

## 2019-12-10 NOTE — Progress Notes (Addendum)
Progress Note  Patient Name: Keith Nelson Date of Encounter: 12/10/2019  Primary Cardiologist: Thomasene Ripple, DO   Subjective   Breathing feels better.  Still on 9 L high flow oxygen.  Inpatient Medications    Scheduled Meds: . acetaminophen  1,000 mg Oral Q6H   Or  . acetaminophen (TYLENOL) oral liquid 160 mg/5 mL  1,000 mg Oral Q6H  . bisacodyl  10 mg Oral Daily  . Chlorhexidine Gluconate Cloth  6 each Topical Daily  . diltiazem  10 mg Intravenous Once  . insulin aspart  0-24 Units Subcutaneous TID AC & HS  . levothyroxine  112 mcg Oral Q0600  . mouth rinse  15 mL Mouth Rinse BID  . senna-docusate  1 tablet Oral QHS  . sodium chloride flush  10-40 mL Intracatheter Q12H  . tamsulosin  0.4 mg Oral Daily   Continuous Infusions: . sodium chloride Stopped (12/09/19 0933)  . diltiazem (CARDIZEM) infusion 5 mg/hr (12/10/19 0404)  . heparin     PRN Meds: Place/Maintain arterial line **AND** sodium chloride, lidocaine (PF), ondansetron (ZOFRAN) IV, sodium chloride flush, traMADol   Vital Signs    Vitals:   12/10/19 0700 12/10/19 0800 12/10/19 0805 12/10/19 0900  BP: 139/65 112/60 112/60 113/64  Pulse: (!) 48 78 79 76  Resp: 17 16 13 13   Temp:      TempSrc:      SpO2: 94% 96%  97%  Weight:      Height:        Intake/Output Summary (Last 24 hours) at 12/10/2019 0943 Last data filed at 12/10/2019 0900 Gross per 24 hour  Intake 1235.53 ml  Output 1400 ml  Net -164.47 ml   Last 3 Weights 12/10/2019 12/09/2019 12/08/2019  Weight (lbs) 195 lb 5.2 oz (No Data) 190 lb 7.6 oz  Weight (kg) 88.6 kg (No Data) 86.4 kg      Telemetry    Atrial fibrillation, rate controlled- Personally Reviewed  ECG    Physical Exam   GEN: No acute distress.   Neck: No JVD Cardiac:  Irregularly irregular,  Respiratory:  Decreased breath sounds at the left base GI: Soft, nontender, non-distended, obese MS:  Trace lower extremity edema; No deformity. Neuro:  Nonfocal  Psych: Normal  affect   Labs    High Sensitivity Troponin:   Recent Labs  Lab 12/07/19 1042  TROPONINIHS 65*      Chemistry Recent Labs  Lab 12/07/19 0110 12/09/19 0335 12/09/19 0438 12/09/19 0608 12/10/19 0425  NA 135   < > 133* 134* 131*  K 5.3*   < > 5.7* 5.0 5.1  CL 99  --  98  --  94*  CO2 26  --  23  --  28  GLUCOSE 142*  --  161*  --  108*  BUN 39*  --  46*  --  45*  CREATININE 2.08*  --  2.13*  --  1.95*  CALCIUM 8.3*  --  8.5*  --  8.7*  PROT 6.2*  --   --   --  6.5  ALBUMIN 2.7*  --   --   --  2.6*  AST 17  --   --   --  27  ALT 11  --   --   --  21  ALKPHOS 54  --   --   --  69  BILITOT 1.3*  --   --   --  0.7  GFRNONAA 28*  --  27*  --  30*  GFRAA 32*  --  31*  --  35*  ANIONGAP 10  --  12  --  9   < > = values in this interval not displayed.     Hematology Recent Labs  Lab 12/07/19 0110 12/09/19 0335 12/09/19 0438 12/09/19 0608 12/10/19 0425  WBC 15.4*  --  10.0  --  10.9*  RBC 4.18*  --  3.90*  --  4.05*  HGB 11.9*   < > 11.2* 11.6* 11.5*  HCT 37.4*   < > 36.8* 34.0* 37.7*  MCV 89.5  --  94.4  --  93.1  MCH 28.5  --  28.7  --  28.4  MCHC 31.8  --  30.4  --  30.5  RDW 15.7*  --  15.6*  --  15.2  PLT 181  --  218  --  255   < > = values in this interval not displayed.    BNPNo results for input(s): BNP, PROBNP in the last 168 hours.   DDimer No results for input(s): DDIMER in the last 168 hours.   Radiology    DG Chest 1 View  Result Date: 12/09/2019 CLINICAL DATA:  Shortness of breath. EXAM: CHEST  1 VIEW COMPARISON:  Dec 08, 2019. FINDINGS: Stable cardiomegaly. Right internal jugular catheter is unchanged in position. Pericardial drain is again noted and unchanged. Moderate left pleural effusion is noted with underlying atelectasis or infiltrate. Bony thorax is unremarkable. IMPRESSION: Stable moderate left pleural effusion with underlying atelectasis or infiltrate. Stable pericardial drain is noted. Electronically Signed   By: Marijo Conception M.D.    On: 12/09/2019 08:50   DG CHEST PORT 1 VIEW  Result Date: 12/10/2019 CLINICAL DATA:  Shortness of breath, pericardial effusion. EXAM: PORTABLE CHEST 1 VIEW COMPARISON:  12/09/2019 and CT chest 12/07/2019. FINDINGS: Right IJ central line has been removed. Pericardial window procedure with pericardial drain in place. Cardiomediastinal silhouette is enlarged, unchanged. Thoracic aorta is calcified. Left basilar collapse/consolidation and moderate left pleural effusion, unchanged. Mild central pulmonary vascular congestion. IMPRESSION: 1. Pericardial window procedure with pericardial drain in place. Cardiopericardial silhouette is stable in size and configuration. 2. Left basilar collapse/consolidation with a moderate left pleural effusion, similar. 3. Mild central pulmonary vascular congestion. Electronically Signed   By: Lorin Picket M.D.   On: 12/10/2019 09:40   DG Chest Port 1 View  Result Date: 12/08/2019 CLINICAL DATA:  Pericardial effusion EXAM: PORTABLE CHEST 1 VIEW COMPARISON:  CT chest 12/07/2019 FINDINGS: Right jugular central venous catheter with the tip projecting over the SVC. Pericardial drain is again noted. Small bilateral pleural effusions, left greater than right. Bilateral mild interstitial thickening. No pneumothorax. Stable cardiomegaly. Thoracic aortic atherosclerosis. No acute osseous abnormality. IMPRESSION: 1. Right jugular central venous catheter with the tip projecting over the SVC. Pericardial drain is again noted. 2. Small bilateral pleural effusions, left greater than right. 3. Stable cardiomegaly with pulmonary vascular congestion. Electronically Signed   By: Kathreen Devoid   On: 12/08/2019 13:31   IR US CHEST  Result Date: 12/09/2019 CLINICAL DATA:  Pleural effusions, assess for thoracentesis EXAM: ULTRASOUND OF CHEST SOFT TISSUES TECHNIQUE: Ultrasound examination of the chest wall soft tissues was performed in the area of clinical concern. COMPARISON:  12/09/2019 FINDINGS:  Ultrasound performed of the left posterior chest. Small effusion with left lung collapse/consolidation. Not enough fluid to warrant large volume thoracentesis. Procedure not performed. IMPRESSION: Small left effusion by ultrasound. Electronically Signed  By: Osvaldo Shipper M.D.   On: 12/09/2019 15:58    Cardiac Studies   I personally reviewed the chest x-ray this morning, continued left lower lung field consolidation.  There is some vascular congestion in the right lung now  Patient Profile     86 y.o. male with recurrent pericardial effusion, atrial fibrillation  Assessment & Plan    Atrial fibrillation: Rate controlled.  IV heparin for stroke prevention.  When stable from a surgical standpoint, will switch him back to oral anticoagulation.  Respiratory: We will try to wean oxygen.  Discussed with Dr. Cliffton Asters.  He needs aggressive pulmonary toilet.  Incentive spirometer to bedside.  We will give a dose of IV Lasix today given the vascular congestion.  He may have had some diastolic dysfunction from the atrial fibrillation with rapid ventricular response that he had yesterday.  Radiology did not feel that there is enough effusion for pleurocentesis.  Chronic renal insufficiency: Creatinine better today.  Will have to continue to monitor.  Avoid nephrotoxins.  We will have to follow renal function post diuresis.  HTN: stable  I spoke to the neice today, Fannie Knee, to give her an update.    For questions or updates, please contact [facility] Please consult [email or web address] for contact info under        Signed, Lance Muss, MD  12/10/2019, 9:43 AM

## 2019-12-10 NOTE — Progress Notes (Signed)
Patient ID: Keith Nelson, male   DOB: [birth year], 86 y.o.   MRN: [id number]         [facility]  Date of Admission:  12/06/2019     ASSESSMENT: The cause of his recurrent pericarditis remains unclear.  I do not think that it is likely to be due to infection.  PLAN: 1. Continue observation off of antibiotics 2. I will sign off now  Principal Problem:   Pericardial effusion Active Problems:   Pericardial effusion with cardiac tamponade   Essential hypertension   COPD (chronic obstructive pulmonary disease) (HCC)   Paroxysmal atrial fibrillation with RVR (HCC)   Hypothyroidism   CKD (chronic kidney disease), stage III   Acute on chronic respiratory failure with hypoxia (HCC)   Scheduled Meds: . acetaminophen  1,000 mg Oral Q6H   Or  . acetaminophen (TYLENOL) oral liquid 160 mg/5 mL  1,000 mg Oral Q6H  . bisacodyl  10 mg Oral Daily  . Chlorhexidine Gluconate Cloth  6 each Topical Daily  . diltiazem  10 mg Intravenous Once  . insulin aspart  0-24 Units Subcutaneous TID AC & HS  . levothyroxine  112 mcg Oral Q0600  . mouth rinse  15 mL Mouth Rinse BID  . senna-docusate  1 tablet Oral QHS  . tamsulosin  0.4 mg Oral Daily   Continuous Infusions: . diltiazem (CARDIZEM) infusion 5 mg/hr (12/10/19 0404)  . heparin     PRN Meds:.lidocaine (PF), ondansetron (ZOFRAN) IV, traMADol   SUBJECTIVE: He states that he is feeling much better.  Review of Systems: Review of Systems  Constitutional: Negative for fever.  Respiratory: Negative for cough and shortness of breath.   Cardiovascular: Negative for chest pain.    No Known Allergies  OBJECTIVE: Vitals:   12/10/19 0700 12/10/19 0800 12/10/19 0805 12/10/19 0900  BP: 139/65 112/60 112/60 113/64  Pulse: (!) 48 78 79 76  Resp: 17 16 13 13   Temp:      TempSrc:      SpO2: 94% 96%  97%  Weight:      Height:       Body mass index is 26.49 kg/m.  Physical Exam Constitutional:    Comments: He appears comfortable.  He is sitting up in a chair watching television.  Cardiovascular:     Rate and Rhythm: Normal rate and regular rhythm.     Heart sounds: No murmur. No friction rub.     Comments: Distant heart sounds. Pulmonary:     Effort: Pulmonary effort is normal.     Breath sounds: Normal breath sounds.  Abdominal:     Palpations: Abdomen is soft.     Comments: His abdomen is less distended.     Lab Results Lab Results  Component Value Date   WBC 10.9 (H) 12/10/2019   HGB 11.5 (L) 12/10/2019   HCT 37.7 (L) 12/10/2019   MCV 93.1 12/10/2019   PLT 255 12/10/2019    Lab Results  Component Value Date   CREATININE 1.95 (H) 12/10/2019   BUN 45 (H) 12/10/2019   NA 131 (L) 12/10/2019   K 5.1 12/10/2019   CL 94 (L) 12/10/2019   CO2 28 12/10/2019    Lab Results  Component Value Date   ALT 21 12/10/2019   AST 27 12/10/2019   ALKPHOS 69 12/10/2019   BILITOT 0.7 12/10/2019     Microbiology: Recent Results (from the past 240 hour(s))  Surgical PCR screen     Status:  None   Collection Time: 12/06/19  9:18 PM   Specimen: Nasal Mucosa; Nasal Swab  Result Value Ref Range Status   MRSA, PCR NEGATIVE NEGATIVE Final   Staphylococcus aureus NEGATIVE NEGATIVE Final    Comment: (NOTE) The Xpert SA Assay (FDA approved for NASAL specimens in patients 65 years of age and older), is one component of a comprehensive surveillance program. It is not intended to diagnose infection nor to guide or monitor treatment. Performed at [facility], [place] [address], [place], [place] [address]   Body fluid culture     Status: None   Collection Time: 12/06/19  9:52 PM   Specimen: PATH Cytology Misc. fluid; Body Fluid  Result Value Ref Range Status   Specimen Description FLUID  Final   Special Requests PERICARDIOCENTESIS SYRINGE  Final   Gram Stain   Final    MODERATE WBC PRESENT, PREDOMINANTLY PMN NO ORGANISMS SEEN    Culture   Final    NO GROWTH 3 DAYS Performed  at [facility] [address] [place], [place] [address]    Report Status 12/10/2019 FINAL  Final  Culture, blood (routine x 2)     Status: None (Preliminary result)   Collection Time: 12/07/19  3:44 AM   Specimen: BLOOD RIGHT HAND  Result Value Ref Range Status   Specimen Description BLOOD RIGHT HAND  Final   Special Requests   Final    BOTTLES DRAWN AEROBIC AND ANAEROBIC Blood Culture adequate volume   Culture   Final    NO GROWTH 3 DAYS Performed at [facility], [place] [address] [place], [place] [address]    Report Status PENDING  Incomplete  Culture, blood (routine x 2)     Status: None (Preliminary result)   Collection Time: 12/07/19  3:51 AM   Specimen: BLOOD LEFT HAND  Result Value Ref Range Status   Specimen Description BLOOD LEFT HAND  Final   Special Requests   Final    BOTTLES DRAWN AEROBIC ONLY Blood Culture adequate volume   Culture   Final    NO GROWTH 3 DAYS Performed at [facility], [address] [place], [place] [address]    Report Status PENDING  Incomplete  Blastomyces Antigen     Status: None   Collection Time: 12/07/19  7:03 PM   Specimen: Urine, Clean Catch  Result Value Ref Range Status   Blastomyces Antigen None Detected None Detected ng/mL Final    Comment: (NOTE) Results reported as ng/mL in 0.2 - 14.7 ng/mL range Results above the limit of detection but below 0.2 ng/mL are reported as 'Positive, Below the Limit of Quantification' Results above 14.7 ng/mL are reported as 'Positive, Above the Limit of Quantification'    Specimen Type SERUM  Final    Comment: (NOTE) Performed At: [facility], [place] [id number] Noralyn Pick MD MV:[id number]   Respiratory Panel by RT PCR (Flu A&B, Covid) - Nasopharyngeal Swab     Status: None   Collection Time: 12/08/19  2:16 PM   Specimen: Nasopharyngeal Swab  Result Value Ref Range Status   SARS Coronavirus 2 by RT PCR NEGATIVE NEGATIVE Final      Comment: (NOTE) SARS-CoV-2 target nucleic acids are NOT DETECTED. The SARS-CoV-2 RNA is generally detectable in upper respiratoy specimens during the acute phase of infection. The lowest concentration of SARS-CoV-2 viral copies this assay can detect is 131 copies/mL. A negative result does  not preclude SARS-Cov-2 infection and should not be used as the sole basis for treatment or other patient management decisions. A negative result may occur with  improper specimen collection/handling, submission of specimen other than nasopharyngeal swab, presence of viral mutation(s) within the areas targeted by this assay, and inadequate number of viral copies (<131 copies/mL). A negative result must be combined with clinical observations, patient history, and epidemiological information. The expected result is Negative. Fact Sheet for Patients:  https://www.moore.com/ Fact Sheet for Healthcare Providers:  https://www.young.biz/ This test is not yet ap proved or cleared by the [place] FDA and  has been authorized for detection and/or diagnosis of SARS-CoV-2 by FDA under an Emergency Use Authorization (EUA). This EUA will remain  in effect (meaning this test can be used) for the duration of the COVID-19 declaration under Section 564(b)(1) of the Act, 21 U.S.C. section 360bbb-3(b)(1), unless the authorization is terminated or revoked sooner.    Influenza A by PCR NEGATIVE NEGATIVE Final   Influenza B by PCR NEGATIVE NEGATIVE Final    Comment: (NOTE) The Xpert Xpress SARS-CoV-2/FLU/RSV assay is intended as an aid in  the diagnosis of influenza from Nasopharyngeal swab specimens and  should not be used as a sole basis for treatment. Nasal washings and  aspirates are unacceptable for Xpert Xpress SARS-CoV-2/FLU/RSV  testing. Fact Sheet for Patients: https://www.moore.com/ Fact Sheet for Healthcare  Providers: https://www.young.biz/ This test is not yet approved or cleared by the [place] FDA and  has been authorized for detection and/or diagnosis of SARS-CoV-2 by  FDA under an Emergency Use Authorization (EUA). This EUA will remain  in effect (meaning this test can be used) for the duration of the  Covid-19 declaration under Section 564(b)(1) of the Act, 21  U.S.C. section 360bbb-3(b)(1), unless the authorization is  terminated or revoked. Performed at [facility], [address] [place], [place] [address]     Cliffton Asters, MD [facility] [phone] pager   [phone] cell 12/10/2019, 10:17 AM

## 2019-12-10 NOTE — Progress Notes (Signed)
ANTICOAGULATION CONSULT NOTE - Follow Up Consult  Pharmacy Consult for heparin IV Indication: atrial fibrillation  No Known Allergies  Patient Measurements: Height: 6' (182.9 cm) Weight: 88.6 kg (195 lb 5.2 oz) IBW/kg (Calculated) : 77.6 Heparin Dosing Weight: 88.6 kg  Vital Signs: Temp: 98 F (36.7 C) (05/05 1500) Temp Source: Oral (05/05 1500) BP: 98/66 (05/05 1800) Pulse Rate: 98 (05/05 1800)  Labs: Recent Labs    12/09/19 0438 12/09/19 0438 12/09/19 0608 12/10/19 0425 12/10/19 1351 12/10/19 1830  HGB 11.2*   < > 11.6* 11.5*  --   --   HCT 36.8*  --  34.0* 37.7*  --   --   PLT 218  --   --  255  --   --   APTT  --   --   --   --  64*  --   LABPROT  --   --   --   --  14.1  --   INR  --   --   --   --  1.1  --   HEPARINUNFRC  --   --   --   --   --  0.31  CREATININE 2.13*  --   --  1.95*  --   --    < > = values in this interval not displayed.    Estimated Creatinine Clearance: 29.3 mL/min (A) (by C-G formula based on SCr of 1.95 mg/dL (H)).   Medical History: Past Medical History:  Diagnosis Date  . Anemia   . Arthritis    "joints ache sometimes" (05/07/2018)  . CKD (chronic kidney disease), stage II    "stage II or III" (05/07/2018)  . COPD (chronic obstructive pulmonary disease) (HCC)   . High cholesterol   . History of blood transfusion 2017  . Hypertension   . Hypothyroidism   . On home oxygen therapy    "just at night" (05/07/2018)  . Pericardial effusion 05/07/2018  . Pneumonia 2009; ~ 2017; 02/2018    Medications:  Scheduled:  . acetaminophen  1,000 mg Oral Q6H   Or  . acetaminophen (TYLENOL) oral liquid 160 mg/5 mL  1,000 mg Oral Q6H  . bisacodyl  10 mg Oral Daily  . Chlorhexidine Gluconate Cloth  6 each Topical Daily  . diltiazem  30 mg Oral Q6H  . insulin aspart  0-24 Units Subcutaneous TID AC & HS  . levothyroxine  112 mcg Oral Q0600  . mouth rinse  15 mL Mouth Rinse BID  . senna-docusate  1 tablet Oral QHS  . tamsulosin  0.4 mg Oral  Daily   Infusions:  . heparin 1,150 Units/hr (12/10/19 1800)    Assessment: 86 y/o male presenting with pericardial effusion requiring pericardial window on 5/3. Most recently, patient developed new left pericardial effusion with intervention anticipated. Notably, patient has new-onset AF - not on anticoagulation PTA.   Hemoglobin is low at 11.5, but stable. Platelet count WNL. Patient is at a higher risk of bleeding due to surgical intervention. Therefore, heparin level goal will be 0.3-0.5 and we will not give a bolus.  Heparin drip 1150 uts/hr HL 0.31 at goal  Goal of Therapy:  HL 0.3-0.5 Monitor platelets by anticoagulation protocol: Yes   Plan:  Continue Heparin drip 1150 uts/hr Daily HL, CBC Monitor s/s bleeding    Leota Sauers Pharm.D. CPP, BCPS Clinical Pharmacist [phone] 12/10/2019 7:29 PM

## 2019-12-11 LAB — CBC
HCT: 38.3 % — ABNORMAL LOW (ref 39.0–52.0)
Hemoglobin: 11.9 g/dL — ABNORMAL LOW (ref 13.0–17.0)
MCH: 28.1 pg (ref 26.0–34.0)
MCHC: 31.1 g/dL (ref 30.0–36.0)
MCV: 90.3 fL (ref 80.0–100.0)
Platelets: 251 10*3/uL (ref 150–400)
RBC: 4.24 MIL/uL (ref 4.22–5.81)
RDW: 14.8 % (ref 11.5–15.5)
WBC: 9.7 10*3/uL (ref 4.0–10.5)
nRBC: 0 % (ref 0.0–0.2)

## 2019-12-11 LAB — GLUCOSE, CAPILLARY
Glucose-Capillary: 94 mg/dL (ref 70–99)
Glucose-Capillary: 101 mg/dL — ABNORMAL HIGH (ref 70–99)
Glucose-Capillary: 96 mg/dL (ref 70–99)
Glucose-Capillary: 111 mg/dL — ABNORMAL HIGH (ref 70–99)

## 2019-12-11 LAB — BASIC METABOLIC PANEL
Anion gap: 8 (ref 5–15)
BUN: 40 mg/dL — ABNORMAL HIGH (ref 8–23)
CO2: 30 mmol/L (ref 22–32)
Calcium: 8.6 mg/dL — ABNORMAL LOW (ref 8.9–10.3)
Chloride: 96 mmol/L — ABNORMAL LOW (ref 98–111)
Creatinine, Ser: 1.77 mg/dL — ABNORMAL HIGH (ref 0.61–1.24)
GFR calc Af Amer: 39 mL/min — ABNORMAL LOW (ref >60)
GFR calc non Af Amer: 34 mL/min — ABNORMAL LOW (ref >60)
Glucose, Bld: 177 mg/dL — ABNORMAL HIGH (ref 70–99)
Potassium: 3.7 mmol/L (ref 3.5–5.1)
Sodium: 134 mmol/L — ABNORMAL LOW (ref 135–145)

## 2019-12-11 LAB — HEPARIN LEVEL (UNFRACTIONATED): Heparin Unfractionated: 0.35 IU/mL (ref 0.30–0.70)

## 2019-12-11 MED ORDER — FUROSEMIDE 10 MG/ML IJ SOLN
40.0000 mg | Freq: Once | INTRAMUSCULAR | Status: AC
  Administered 2019-12-11: 10:00:00 40 mg via INTRAVENOUS
  Filled 2019-12-11: qty 4

## 2019-12-11 MED ORDER — POTASSIUM CHLORIDE 10 MEQ/100ML IV SOLN
10.0000 meq | INTRAVENOUS | Status: AC
  Administered 2019-12-11 (×2): 10 meq via INTRAVENOUS
  Filled 2019-12-11 (×2): qty 100

## 2019-12-11 NOTE — Progress Notes (Addendum)
Progress Note  Patient Name: Keith Nelson Date of Encounter: 12/11/2019  Primary Cardiologist: Berniece Salines, DO   Subjective   Feels better today.  Oxygen weaned yesterday.  Inpatient Medications    Scheduled Meds: . acetaminophen  1,000 mg Oral Q6H   Or  . acetaminophen (TYLENOL) oral liquid 160 mg/5 mL  1,000 mg Oral Q6H  . bisacodyl  10 mg Oral Daily  . Chlorhexidine Gluconate Cloth  6 each Topical Daily  . diltiazem  30 mg Oral Q6H  . insulin aspart  0-24 Units Subcutaneous TID AC & HS  . levothyroxine  112 mcg Oral Q0600  . mouth rinse  15 mL Mouth Rinse BID  . senna-docusate  1 tablet Oral QHS  . tamsulosin  0.4 mg Oral Daily   Continuous Infusions: . heparin 1,150 Units/hr (12/11/19 2100)   PRN Meds: lidocaine (PF), ondansetron (ZOFRAN) IV, traMADol   Vital Signs    Vitals:   12/11/19 1900 12/11/19 2000 12/11/19 2100 12/11/19 2132  BP: 120/67 (!) 138/59 140/65   Pulse: 66 66 62 67  Resp: 20 18 16 17   Temp: 98.5 F (36.9 C)     TempSrc: Oral     SpO2: 96% 97% 98% 97%  Weight:      Height:        Intake/Output Summary (Last 24 hours) at 12/11/2019 2156 Last data filed at 12/11/2019 2100 Gross per 24 hour  Intake 954.83 ml  Output 1730 ml  Net -775.17 ml   Last 3 Weights 12/11/2019 12/10/2019 12/09/2019  Weight (lbs) 190 lb 7.6 oz 195 lb 5.2 oz (No Data)  Weight (kg) 86.4 kg 88.6 kg (No Data)      Telemetry    AFib, rate controlled- Personally Reviewed  ECG      Physical Exam   GEN: No acute distress.   Neck: No JVD Cardiac: irregularly irregular  Respiratory: Improved breath sounds at left base GI: Soft, nontender, non-distended  MS: No LE edema; No deformity. Neuro:  Nonfocal  Psych: Normal affect   Labs    High Sensitivity Troponin:   Recent Labs  Lab 12/07/19 1042  TROPONINIHS 65*      Chemistry Recent Labs  Lab 12/07/19 0110 12/09/19 0335 12/09/19 0438 12/09/19 0438 12/09/19 0608 12/10/19 0425 12/11/19 0824  NA  135   < > 133*   < > 134* 131* 134*  K 5.3*   < > 5.7*   < > 5.0 5.1 3.7  CL 99  --  98  --   --  94* 96*  CO2 26  --  23  --   --  28 30  GLUCOSE 142*  --  161*  --   --  108* 177*  BUN 39*  --  46*  --   --  45* 40*  CREATININE 2.08*  --  2.13*  --   --  1.95* 1.77*  CALCIUM 8.3*  --  8.5*  --   --  8.7* 8.6*  PROT 6.2*  --   --   --   --  6.5  --   ALBUMIN 2.7*  --   --   --   --  2.6*  --   AST 17  --   --   --   --  27  --   ALT 11  --   --   --   --  21  --   ALKPHOS 54  --   --   --   --  69  --   BILITOT 1.3*  --   --   --   --  0.7  --   GFRNONAA 28*  --  27*  --   --  30* 34*  GFRAA 32*  --  31*  --   --  35* 39*  ANIONGAP 10  --  12  --   --  9 8   < > = values in this interval not displayed.     Hematology Recent Labs  Lab 12/09/19 0438 12/09/19 0438 12/09/19 0608 12/10/19 0425 12/11/19 0251  WBC 10.0  --   --  10.9* 9.7  RBC 3.90*  --   --  4.05* 4.24  HGB 11.2*   < > 11.6* 11.5* 11.9*  HCT 36.8*   < > 34.0* 37.7* 38.3*  MCV 94.4  --   --  93.1 90.3  MCH 28.7  --   --  28.4 28.1  MCHC 30.4  --   --  30.5 31.1  RDW 15.6*  --   --  15.2 14.8  PLT 218  --   --  255 251   < > = values in this interval not displayed.    BNPNo results for input(s): BNP, PROBNP in the last 168 hours.   DDimer No results for input(s): DDIMER in the last 168 hours.   Radiology    DG CHEST PORT 1 VIEW  Result Date: 12/10/2019 CLINICAL DATA:  Shortness of breath, pericardial effusion. EXAM: PORTABLE CHEST 1 VIEW COMPARISON:  12/09/2019 and CT chest 12/07/2019. FINDINGS: Right IJ central line has been removed. Pericardial window procedure with pericardial drain in place. Cardiomediastinal silhouette is enlarged, unchanged. Thoracic aorta is calcified. Left basilar collapse/consolidation and moderate left pleural effusion, unchanged. Mild central pulmonary vascular congestion. IMPRESSION: 1. Pericardial window procedure with pericardial drain in place. Cardiopericardial silhouette is  stable in size and configuration. 2. Left basilar collapse/consolidation with a moderate left pleural effusion, similar. 3. Mild central pulmonary vascular congestion. Electronically Signed   By: Leanna Battles M.D.   On: 12/10/2019 09:40    Cardiac Studies   Renal functin improving  Patient Profile     86 y.o. male with recurrent pericardial effusion, s/p window  Assessment & Plan    Additional dose of Lasix today. KCl 20 mEq x 1.  Cr improving.  HTN- controlled.  Heparin for stroke prevention in the setting of AFib.  Rate controlled.   Tubes to be removed.  Encourage out of bed with PT when feasible.  COntinue pulm toilet.   For questions or updates, please contact [facility] Please consult [email or web address] for contact info under        Signed, Lance Muss, MD  12/11/2019, 9:56 PM

## 2019-12-11 NOTE — Progress Notes (Signed)
ANTICOAGULATION CONSULT NOTE - Follow Up Consult  Pharmacy Consult for heparin IV Indication: atrial fibrillation  No Known Allergies  Patient Measurements: Height: 6' (182.9 cm) Weight: 86.4 kg (190 lb 7.6 oz) IBW/kg (Calculated) : 77.6 Heparin Dosing Weight: 88.6 kg  Vital Signs: Temp: 98.2 F (36.8 C) (05/06 0400) Temp Source: Oral (05/06 0400) BP: 158/64 (05/06 0600) Pulse Rate: 67 (05/06 0600)  Labs: Recent Labs    12/09/19 0438 12/09/19 0438 12/09/19 0608 12/09/19 0608 12/10/19 0425 12/10/19 1351 12/10/19 1830 12/11/19 0251  HGB 11.2*   < > 11.6*   < > 11.5*  --   --  11.9*  HCT 36.8*   < > 34.0*  --  37.7*  --   --  38.3*  PLT 218  --   --   --  255  --   --  251  APTT  --   --   --   --   --  64*  --   --   LABPROT  --   --   --   --   --  14.1  --   --   INR  --   --   --   --   --  1.1  --   --   HEPARINUNFRC  --   --   --   --   --   --  0.31 0.35  CREATININE 2.13*  --   --   --  1.95*  --   --   --    < > = values in this interval not displayed.    Estimated Creatinine Clearance: 29.3 mL/min (A) (by C-G formula based on SCr of 1.95 mg/dL (H)).   Medical History: Past Medical History:  Diagnosis Date  . Anemia   . Arthritis    "joints ache sometimes" (05/07/2018)  . CKD (chronic kidney disease), stage II    "stage II or III" (05/07/2018)  . COPD (chronic obstructive pulmonary disease) ([place])   . High cholesterol   . History of blood transfusion 2017  . Hypertension   . Hypothyroidism   . On home oxygen therapy    "just at night" (05/07/2018)  . Pericardial effusion 05/07/2018  . Pneumonia 2009; ~ 2017; 02/2018    Medications:  Scheduled:  . acetaminophen  1,000 mg Oral Q6H   Or  . acetaminophen (TYLENOL) oral liquid 160 mg/5 mL  1,000 mg Oral Q6H  . bisacodyl  10 mg Oral Daily  . Chlorhexidine Gluconate Cloth  6 each Topical Daily  . diltiazem  30 mg Oral Q6H  . insulin aspart  0-24 Units Subcutaneous TID AC & HS  . levothyroxine  112 mcg  Oral Q0600  . mouth rinse  15 mL Mouth Rinse BID  . senna-docusate  1 tablet Oral QHS  . tamsulosin  0.4 mg Oral Daily   Infusions:  . heparin 1,150 Units/hr (12/11/19 0867)    Assessment: 86 y/o male presenting with pericardial effusion requiring pericardial window on 5/3. Most recently, patient developed new left pericardial effusion with intervention anticipated. Notably, patient has new-onset AF - not on anticoagulation PTA.   Hemoglobin is low at 11.9, but stable. Platelet count WNL. Patient is at a higher risk of bleeding due to surgical intervention. Therefore, heparin level goal will be 0.3-0.5 and no bolus was given. Confirmatory heparin level at goal 0.35 with heparin drip at 1150 units/hr. No bleeding or infusion issues.   Goal of Therapy:  HL  0.3-0.5 Monitor platelets by anticoagulation protocol: Yes   Plan:  Continue Heparin drip 1150 units/hr Daily HL, CBC Monitor s/s bleeding   Ellison Carwin, PharmD PGY1 Pharmacy Resident

## 2019-12-11 NOTE — Progress Notes (Signed)
     [address]       [place] [address]             [phone]       Doing well Breathing much better today  Today's Vitals   12/11/19 0500 12/11/19 0600 12/11/19 0627 12/11/19 0700  BP: 110/60 (!) 158/64  (!) 168/71  Pulse: (!) 57 67  72  Resp: 16 15  16   Temp:      TempSrc:      SpO2: 95% 92%  (!) 86%  Weight:   86.4 kg   Height:      PainSc:       Body mass index is 25.83 kg/m.  Alert NAD Sinus EWOB on [place] incision clean Serous CT output.  out  POD 3 s/p R RATS, pericardial window Will remove CT today Continue pulm toilet  Ethne Jeon O Katrinia Straker

## 2019-12-11 NOTE — Plan of Care (Signed)

## 2019-12-11 NOTE — Plan of Care (Signed)
  Problem: Education: Goal: Knowledge of General Education information will improve Description: Including pain rating scale, medication(s)/side effects and non-pharmacologic comfort measures Outcome: Progressing   Problem: Clinical Measurements: Goal: Respiratory complications will improve Outcome: Progressing Goal: Cardiovascular complication will be avoided Outcome: Progressing   Problem: Activity: Goal: Risk for activity intolerance will decrease Outcome: Progressing   Problem: Nutrition: Goal: Adequate nutrition will be maintained Outcome: Progressing   Problem: Elimination: Goal: Will not experience complications related to bowel motility Outcome: Progressing   Problem: Pain Managment: Goal: General experience of comfort will improve Outcome: Progressing

## 2019-12-12 LAB — CULTURE, BLOOD (ROUTINE X 2)
Culture: NO GROWTH
Culture: NO GROWTH
Special Requests: ADEQUATE
Special Requests: ADEQUATE

## 2019-12-12 LAB — MISC LABCORP TEST (SEND OUT): Labcorp test code: 9985

## 2019-12-12 LAB — BASIC METABOLIC PANEL
Anion gap: 12 (ref 5–15)
BUN: 39 mg/dL — ABNORMAL HIGH (ref 8–23)
CO2: 27 mmol/L (ref 22–32)
Calcium: 8.5 mg/dL — ABNORMAL LOW (ref 8.9–10.3)
Chloride: 96 mmol/L — ABNORMAL LOW (ref 98–111)
Creatinine, Ser: 1.61 mg/dL — ABNORMAL HIGH (ref 0.61–1.24)
GFR calc Af Amer: 44 mL/min — ABNORMAL LOW (ref >60)
GFR calc non Af Amer: 38 mL/min — ABNORMAL LOW (ref >60)
Glucose, Bld: 101 mg/dL — ABNORMAL HIGH (ref 70–99)
Potassium: 3.8 mmol/L (ref 3.5–5.1)
Sodium: 135 mmol/L (ref 135–145)

## 2019-12-12 LAB — CBC
HCT: 36.7 % — ABNORMAL LOW (ref 39.0–52.0)
Hemoglobin: 11.7 g/dL — ABNORMAL LOW (ref 13.0–17.0)
MCH: 28.5 pg (ref 26.0–34.0)
MCHC: 31.9 g/dL (ref 30.0–36.0)
MCV: 89.3 fL (ref 80.0–100.0)
Platelets: 252 10*3/uL (ref 150–400)
RBC: 4.11 MIL/uL — ABNORMAL LOW (ref 4.22–5.81)
RDW: 14.8 % (ref 11.5–15.5)
WBC: 8.5 10*3/uL (ref 4.0–10.5)
nRBC: 0 % (ref 0.0–0.2)

## 2019-12-12 LAB — GLUCOSE, CAPILLARY
Glucose-Capillary: 86 mg/dL (ref 70–99)
Glucose-Capillary: 126 mg/dL — ABNORMAL HIGH (ref 70–99)
Glucose-Capillary: 101 mg/dL — ABNORMAL HIGH (ref 70–99)
Glucose-Capillary: 121 mg/dL — ABNORMAL HIGH (ref 70–99)

## 2019-12-12 LAB — HEPARIN LEVEL (UNFRACTIONATED): Heparin Unfractionated: 0.45 IU/mL (ref 0.30–0.70)

## 2019-12-12 MED ORDER — APIXABAN 2.5 MG PO TABS
2.5000 mg | ORAL_TABLET | Freq: Two times a day (BID) | ORAL | Status: DC
  Administered 2019-12-12 – 2019-12-16 (×9): 2.5 mg via ORAL
  Filled 2019-12-12 (×9): qty 1

## 2019-12-12 MED ORDER — FUROSEMIDE 10 MG/ML IJ SOLN
40.0000 mg | Freq: Once | INTRAMUSCULAR | Status: AC
  Administered 2019-12-12: 40 mg via INTRAVENOUS
  Filled 2019-12-12: qty 4

## 2019-12-12 MED ORDER — POTASSIUM CHLORIDE CRYS ER 20 MEQ PO TBCR
20.0000 meq | EXTENDED_RELEASE_TABLET | Freq: Once | ORAL | Status: AC
  Administered 2019-12-12: 20 meq via ORAL
  Filled 2019-12-12: qty 1

## 2019-12-12 NOTE — Care Management (Signed)
Case management placed an insurance benefits check to check on the cost of Eliquis 2.5 mg by mouth twice daily for a total of 60 tablets.

## 2019-12-12 NOTE — Progress Notes (Signed)
Pt HR is low, fluctuating between 35-60, cardiology fellow notified. EKG obtained.

## 2019-12-12 NOTE — Progress Notes (Addendum)
Progress Note  Patient Name: Lexine Baton Date of Encounter: 12/12/2019  Primary Cardiologist: Berniece Salines, DO   Subjective   Feels better.  Inpatient Medications    Scheduled Meds: . acetaminophen  1,000 mg Oral Q6H   Or  . acetaminophen (TYLENOL) oral liquid 160 mg/5 mL  1,000 mg Oral Q6H  . apixaban  2.5 mg Oral BID  . bisacodyl  10 mg Oral Daily  . Chlorhexidine Gluconate Cloth  6 each Topical Daily  . diltiazem  30 mg Oral Q6H  . insulin aspart  0-24 Units Subcutaneous TID AC & HS  . levothyroxine  112 mcg Oral Q0600  . mouth rinse  15 mL Mouth Rinse BID  . senna-docusate  1 tablet Oral QHS  . tamsulosin  0.4 mg Oral Daily   Continuous Infusions:  PRN Meds: lidocaine (PF), ondansetron (ZOFRAN) IV, traMADol   Vital Signs    Vitals:   12/12/19 0500 12/12/19 0600 12/12/19 0646 12/12/19 0800  BP: 132/68 139/61  118/71  Pulse: 60 (!) 58 63 66  Resp: 17 16 16 16   Temp:   98.4 F (36.9 C)   TempSrc:   Oral   SpO2: 97% 96% 96% 95%  Weight:      Height:        Intake/Output Summary (Last 24 hours) at 12/12/2019 1128 Last data filed at 12/12/2019 0800 Gross per 24 hour  Intake 1079.9 ml  Output 2015 ml  Net -935.1 ml   Last 3 Weights 12/11/2019 12/10/2019 12/09/2019  Weight (lbs) 190 lb 7.6 oz 195 lb 5.2 oz (No Data)  Weight (kg) 86.4 kg 88.6 kg (No Data)      Telemetry    Normal sinus rhythm- Personally Reviewed  ECG      Physical Exam   GEN: No acute distress.   Neck: No JVD Cardiac: RRR,   Respiratory:  Improved breath sounds at the left lung base.  He also use the incentive spirometer in front of [place]. GI: Soft, nontender, non-distended, obese MS:  Trivial lower extremity edema; No deformity. Neuro:  Nonfocal  Psych: Normal affect   Labs    High Sensitivity Troponin:   Recent Labs  Lab 12/07/19 1042  TROPONINIHS 65*      Chemistry Recent Labs  Lab 12/07/19 0110 12/09/19 0335 12/10/19 0425 12/11/19 0824 12/12/19 0242  NA 135    < > 131* 134* 135  K 5.3*   < > 5.1 3.7 3.8  CL 99   < > 94* 96* 96*  CO2 26   < > 28 30 27   GLUCOSE 142*   < > 108* 177* 101*  BUN 39*   < > 45* 40* 39*  CREATININE 2.08*   < > 1.95* 1.77* 1.61*  CALCIUM 8.3*   < > 8.7* 8.6* 8.5*  PROT 6.2*  --  6.5  --   --   ALBUMIN 2.7*  --  2.6*  --   --   AST 17  --  27  --   --   ALT 11  --  21  --   --   ALKPHOS 54  --  69  --   --   BILITOT 1.3*  --  0.7  --   --   GFRNONAA 28*   < > 30* 34* 38*  GFRAA 32*   < > 35* 39* 44*  ANIONGAP 10   < > 9 8 12    < > = values in this  interval not displayed.     Hematology Recent Labs  Lab 12/10/19 0425 12/11/19 0251 12/12/19 0242  WBC 10.9* 9.7 8.5  RBC 4.05* 4.24 4.11*  HGB 11.5* 11.9* 11.7*  HCT 37.7* 38.3* 36.7*  MCV 93.1 90.3 89.3  MCH 28.4 28.1 28.5  MCHC 30.5 31.1 31.9  RDW 15.2 14.8 14.8  PLT 255 251 252    BNPNo results for input(s): BNP, PROBNP in the last 168 hours.   DDimer No results for input(s): DDIMER in the last 168 hours.   Radiology    No results found.  Cardiac Studies     Patient Profile     86 y.o. male pericardial effusion  Assessment & Plan    1.  Pericardial effusion: Drains have been removed.  Discussed with Dr. Cliffton Asters.  He can go to the floor.  Will stop heparin and start low-dose Eliquis for PAF.   2.  Paroxysmal atrial fibrillation: He is back in normal sinus rhythm.  Eliquis for stroke prevention.  Chronic renal insufficiency: Improving.  We will give another dose of IV Lasix today.  Hypertension: Blood pressure has been relatively well controlled.  Would hold off on ARB given renal insufficiency.  Will hold amlodipine at this point.  If blood pressure rises, would add back low-dose amlodipine.  Spoke with nurse about getting case manager to look into home oxygen.  He has night time oxygen at home but it is not portable.   For questions or updates, please contact [facility] Please consult [email or web address] for contact info under          Signed, Lance Muss, MD  12/12/2019, 11:28 AM

## 2019-12-12 NOTE — Progress Notes (Signed)
ANTICOAGULATION CONSULT NOTE - Follow Up Consult  Pharmacy Consult for heparin IV>Eliquis Indication: atrial fibrillation  No Known Allergies  Patient Measurements: Height: 6' (182.9 cm) Weight: 86.4 kg (190 lb 7.6 oz) IBW/kg (Calculated) : 77.6 Heparin Dosing Weight: 88.6 kg  Vital Signs: Temp: 98.4 F (36.9 C) (05/07 0646) Temp Source: Oral (05/07 0646) BP: 139/61 (05/07 0600) Pulse Rate: 63 (05/07 0646)  Labs: Recent Labs    12/10/19 0425 12/10/19 0425 12/10/19 1351 12/10/19 1830 12/11/19 0251 12/11/19 0824 12/12/19 0242  HGB 11.5*   < >  --   --  11.9*  --  11.7*  HCT 37.7*  --   --   --  38.3*  --  36.7*  PLT 255  --   --   --  251  --  252  APTT  --   --  64*  --   --   --   --   LABPROT  --   --  14.1  --   --   --   --   INR  --   --  1.1  --   --   --   --   HEPARINUNFRC  --   --   --  0.31 0.35  --  0.45  CREATININE 1.95*  --   --   --   --  1.77* 1.61*   < > = values in this interval not displayed.    Estimated Creatinine Clearance: 35.5 mL/min (A) (by C-G formula based on SCr of 1.61 mg/dL (H)).   Medical History: Past Medical History:  Diagnosis Date  . Anemia   . Arthritis    "joints ache sometimes" (05/07/2018)  . CKD (chronic kidney disease), stage II    "stage II or III" (05/07/2018)  . COPD (chronic obstructive pulmonary disease) ([facility])   . High cholesterol   . History of blood transfusion 2017  . Hypertension   . Hypothyroidism   . On home oxygen therapy    "just at night" (05/07/2018)  . Pericardial effusion 05/07/2018  . Pneumonia 2009; ~ 2017; 02/2018    Medications:  Scheduled:  . acetaminophen  1,000 mg Oral Q6H   Or  . acetaminophen (TYLENOL) oral liquid 160 mg/5 mL  1,000 mg Oral Q6H  . bisacodyl  10 mg Oral Daily  . Chlorhexidine Gluconate Cloth  6 each Topical Daily  . diltiazem  30 mg Oral Q6H  . insulin aspart  0-24 Units Subcutaneous TID AC & HS  . levothyroxine  112 mcg Oral Q0600  . mouth rinse  15 mL Mouth Rinse BID   . senna-docusate  1 tablet Oral QHS  . tamsulosin  0.4 mg Oral Daily   Infusions:  . heparin 1,150 Units/hr (12/12/19 0600)    Assessment: 86 y/o male presenting with pericardial effusion requiring pericardial window on 5/3. Most recently, patient developed new left pericardial effusion with intervention anticipated. Notably, patient has new-onset AF - not on anticoagulation PTA. Pharmacy was consulted to start heparin IV infusion for AF. Now, we will transition to Eliquis.   Hemoglobin is low at 11.7, but stable. Platelet count WNL. No bleeding concerns at this time.   Goal of Therapy:  HL 0.3-0.5  Monitor platelets by anticoagulation protocol: Yes   Plan:  Discontinue heparin IV infusion Start Eliquis 2.5 mg BID today Sent case management consult for Eliquis affordability   Ellison Carwin, PharmD PGY1 Pharmacy Resident

## 2019-12-12 NOTE — Care Management (Signed)
Per CM [place] pt was active with [employer] PTA for home oxygen. CM [place] informed [employer] that pt will need a portable oxygen portable tank for transport home.

## 2019-12-12 NOTE — TOC Benefit Eligibility Note (Signed)
Transition of Care [facility]) Benefit Eligibility Note    Patient Details  Name: Keith Nelson MRN: [id number] Date of Birth: 01/17/1930   Medication/Dose: Everlene Balls  2.5 MG BID  Covered?: Yes  Tier: 3 Drug  Prescription Coverage Preferred Pharmacy: [facility]  Spoke with Person/Company/Phone Number:: Eulah Citizen   @  [employer] # [phone]  Co-Pay: $4.00  Prior Approval: No  Deductible: (NO DEDUCTIBLE WITH PLAN  /   LOWE INCOME SUBSIDY LEVEL TWO)       Mardene Sayer Phone Number: 12/12/2019, 11:58 AM

## 2019-12-13 ENCOUNTER — Inpatient Hospital Stay (HOSPITAL_COMMUNITY): Payer: Medicare HMO

## 2019-12-13 DIAGNOSIS — I5033 Acute on chronic diastolic (congestive) heart failure: Secondary | ICD-10-CM

## 2019-12-13 LAB — BASIC METABOLIC PANEL
Anion gap: 12 (ref 5–15)
BUN: 31 mg/dL — ABNORMAL HIGH (ref 8–23)
CO2: 26 mmol/L (ref 22–32)
Calcium: 8.5 mg/dL — ABNORMAL LOW (ref 8.9–10.3)
Chloride: 97 mmol/L — ABNORMAL LOW (ref 98–111)
Creatinine, Ser: 1.66 mg/dL — ABNORMAL HIGH (ref 0.61–1.24)
GFR calc Af Amer: 42 mL/min — ABNORMAL LOW (ref >60)
GFR calc non Af Amer: 37 mL/min — ABNORMAL LOW (ref >60)
Glucose, Bld: 86 mg/dL (ref 70–99)
Potassium: 4.1 mmol/L (ref 3.5–5.1)
Sodium: 135 mmol/L (ref 135–145)

## 2019-12-13 LAB — GLUCOSE, CAPILLARY
Glucose-Capillary: 178 mg/dL — ABNORMAL HIGH (ref 70–99)
Glucose-Capillary: 90 mg/dL (ref 70–99)
Glucose-Capillary: 91 mg/dL (ref 70–99)
Glucose-Capillary: 91 mg/dL (ref 70–99)
Glucose-Capillary: 96 mg/dL (ref 70–99)
Glucose-Capillary: 96 mg/dL (ref 70–99)

## 2019-12-13 LAB — BRAIN NATRIURETIC PEPTIDE: B Natriuretic Peptide: 194.1 pg/mL — ABNORMAL HIGH (ref 0.0–100.0)

## 2019-12-13 LAB — CBC
HCT: 36.6 % — ABNORMAL LOW (ref 39.0–52.0)
Hemoglobin: 11.4 g/dL — ABNORMAL LOW (ref 13.0–17.0)
MCH: 28.1 pg (ref 26.0–34.0)
MCHC: 31.1 g/dL (ref 30.0–36.0)
MCV: 90.1 fL (ref 80.0–100.0)
Platelets: 263 10*3/uL (ref 150–400)
RBC: 4.06 MIL/uL — ABNORMAL LOW (ref 4.22–5.81)
RDW: 14.9 % (ref 11.5–15.5)
WBC: 7.9 10*3/uL (ref 4.0–10.5)
nRBC: 0 % (ref 0.0–0.2)

## 2019-12-13 MED ORDER — DILTIAZEM HCL 60 MG PO TABS
30.0000 mg | ORAL_TABLET | Freq: Two times a day (BID) | ORAL | Status: DC
  Administered 2019-12-13 – 2019-12-14 (×4): 30 mg via ORAL
  Filled 2019-12-13 (×5): qty 1

## 2019-12-13 MED ORDER — FUROSEMIDE 10 MG/ML IJ SOLN
60.0000 mg | Freq: Once | INTRAMUSCULAR | Status: AC
  Administered 2019-12-13: 60 mg via INTRAVENOUS
  Filled 2019-12-13: qty 6

## 2019-12-13 NOTE — Progress Notes (Signed)
SATURATION QUALIFICATIONS: (This note is used to comply with regulatory documentation for home oxygen)  Patient Saturations on 3 Liters of oxygen while at rest = 95%   Patient Saturations on 3  Liters of oxygen while Ambulating = 82%  02 increased to 6 liters after drop to 82% with saturation increase to 95% for remaining ambulation in hall.  Please briefly explain why patient needs home oxygen: Unable to maintain 02 WNL without supplemental 02

## 2019-12-13 NOTE — Discharge Instructions (Addendum)
Robot-Assisted Thoracic Surgery, Care After This sheet gives you information about how to care for yourself after your procedure. Your health care provider may also give you more specific instructions. If you have problems or questions, contact your health care provider. What can I expect after the procedure? After the procedure, it is common to have:  Some pain and aches in the area of your surgical cuts (incisions).  Pain when breathing in (inhaling) and coughing.  Tiredness (fatigue).  Trouble sleeping.  Constipation. Follow these instructions at home: Medicines  Take over-the-counter and prescription medicines only as told by your health care provider.  If you were prescribed an antibiotic medicine, take it as told by your health care provider. Do not stop taking the antibiotic even if you start to feel better.  Talk with your health care provider about safe and effective ways to manage pain after your procedure. Pain management should fit your specific health needs.  Take prescription pain medicine before pain becomes severe. Relieving and controlling your pain will make breathing easier for you. Activity  Return to your normal activities as told by your health care provider. Ask your health care provider what activities are safe for you.  Do not lift anything that is heavier than 10 lb (4.5 kg), or the limit that you are told, until your health care provider says that it is safe.  Avoid sitting for a long time without moving. Get up and move around one or more times every few hours. Bathing  Do not take baths, swim, or use a hot tub until your health care provider approves. You may take showers. Incision care  Follow instructions from your health care provider about how to take care of your incision(s). Make sure you: ? Wash your hands with soap and water before you change your bandage (dressing). If soap and water are not available, use hand sanitizer. ? Change your  dressing as told by your health care provider. ? Leave stitches (sutures), skin glue, or adhesive strips in place. These skin closures may need to stay in place for 2 weeks or longer. If adhesive strip edges start to loosen and curl up, you may trim the loose edges. Do not remove adhesive strips completely unless your health care provider tells you to do that.  Check your incision area every day for signs of infection. Check for: ? Redness, swelling, or pain. ? Fluid or blood. ? Warmth. ? Pus or a bad smell. Driving  Ask your health care provider when it is safe for you to drive.  Do not drive or use heavy machinery while taking prescription pain medicine. Eating and drinking  Follow instructions from your health care provider about eating or drinking restrictions. These will vary depending on what procedure you had. Your health care provider may recommend: ? A liquid diet or soft diet for the first few days. ? Meals that are smaller and more frequent. ? A diet of fruits, vegetables, whole grains, and low-fat proteins. ? Limiting foods that are high in processed sugar and fat, including fried and sweet foods. Pneumonia prevention   Do not use any products that contain nicotine or tobacco, such as cigarettes and e-cigarettes. If you need help quitting, ask your health care provider.  Avoid secondhand smoke.  Do deep breathing exercises and cough regularly as directed. This helps to clear mucus and prevent pneumonia. If it hurts to cough, try one of these methods to ease your pain when you cough: ? Hold  a pillow against your chest. ? Place the palms of both hands over your incisions (use splinting).  Use an incentive spirometer as directed. This device measures how much air your lungs are getting with each breath. Using this will improve your breathing.  Do pulmonary rehabilitation as directed. This is a program that includes exercise, education, and support. General  instructions  Wear compression stockings as told by your health care provider. These stockings help to prevent blood clots and reduce swelling in your legs.  If you have a drainage tube: ? Follow instructions from your health care provider about how to take care of it. ? Do not travel by airplane after your tube is removed until your health care provider tells you it is safe.  To prevent or treat constipation while you are taking prescription pain medicine, your health care provider may recommend that you: ? Drink enough fluid to keep your urine pale yellow. ? Take over-the-counter or prescription medicines. ? Eat foods that are high in fiber, such as fresh fruits and vegetables, whole grains, and beans. ? Limit foods that are high in fat and processed sugars, such as fried and sweet foods.  Keep all follow-up visits as told by your health care provider. This is important. Contact a health care provider if:  You have redness, swelling, or pain around an incision.  You have fluid or blood coming from an incision.  An incision feels warm to the touch.  You have pus or a bad smell coming from an incision.  You have a fever.  You cannot eat or drink without vomiting.  Your prescription pain medicine is not controlling your pain. Get help right away if:  You have chest pain.  Your heart is beating quickly.  You have trouble breathing.  You have trouble speaking.  You are confused.  You feel weak or dizzy, or you faint. These symptoms may represent a serious problem that is an emergency. Do not wait to see if the symptoms will go away. Get medical help right away. Call your local emergency services (911 in the [place] Do not drive yourself to the hospital. Summary  Talk with your health care provider about safe and effective ways to manage pain after your procedure. Pain management should fit your specific health needs.  Return to your normal activities as told by your health  care provider. Ask your health care provider what activities are safe for you.  Do deep breathing exercises and cough regularly as directed. This helps to clear mucus and prevent pneumonia. If it hurts to cough, ease pain by holding a pillow against your chest or by placing the palms of both hands over your incisions (splinting). This information is not intended to replace advice given to you by your health care provider. Make sure you discuss any questions you have with your health care provider. Document Revised: 05/23/2019 Document Reviewed: 11/27/2016 Elsevier Patient Education  2020 [employer].  Conn's Current Therapy 2017 (pp. 79-158). [place], [place]: Elsevier, Inc."> Stoelting's Anesthesia and Co-Existing Disease (7th ed., pp. 225-236). [place]: Elsevier, Inc."> Braunwald's Heart Disease (11th ed., pp. [phone]). [place]: Elsevier.">  Pericardial Effusion  Pericardial effusion is a buildup of fluid around the heart. The heart is surrounded by a thin, double-layered sac (pericardium). When fluid builds up in this sac, it can put too much pressure on the heart and make it harder for the heart to pump blood (cardiac tamponade). This can be life-threatening. What are the causes? Often,  the cause of pericardial effusion is not known (idiopathic effusion). In some cases, the condition may be caused by:  Infections from a virus, fungus, parasite, or bacteria.  Damage to the pericardium from heart surgery or a heart attack.  Inflammatory diseases, such as rheumatoid arthritis or lupus.  Kidney or thyroid disease.  Cancer or treatment for cancer, including radiation or chemotherapy.  Certain medicines, including medicines for tuberculosis or seizures.  Chest injury. What are the signs or symptoms? Pericardial effusion may not cause symptoms at first, especially if the fluid builds up slowly. In time, pressure on the heart may cause:  Chest pain and trouble breathing.  This may include pain and shortness of breath that get worse when lying down.  Dizziness and fainting.  Coughing and hiccups.  Skipped heartbeats.  Anxiety and confusion.  A bluish skin color (cyanosis).  Swollen legs and ankles.  A feeling of fullness in the chest. How is this diagnosed? This condition is diagnosed based on your symptoms and testing, which may include:  A test that creates ultrasound images of your heart (echocardiogram).  A test to examine the electrical functions of your heart (electrocardiogram).  Chest X-ray.  CT scan.  MRI.  Blood tests. How is this treated? Treatment for this condition depends on the cause of your condition and how severe your symptoms are. Treatment may include:  Medicines, such as: ? NSAIDs, such as ibuprofen. ? Anti-inflammatory medicines, such as steroids. ? Medicines to fight infection, such as antibiotics.  Hospital treatment. This may be necessary if the fluid around the heart prevents it from pumping enough blood. Treatment in the hospital may include: ? IV fluids. ? Breathing support.  Surgery. This may be needed in severe cases. Surgery may include: ? A procedure to remove fluid from the pericardium by placing a needle into it (pericardiocentesis). ? A procedure to make a permanent opening in the pericardium (pericardial window). ? Open heart surgery. Follow these instructions at home:  Take over-the-counter and prescription medicines only as told by your health care provider.  If you were prescribed a medicine to fight infection, such as antibiotic medicine, take it as told by your health care provider. Do not stop taking the medicine even if you start to feel better.  Rest as told by your health care provider. Ask your health care provider what activities are safe for you.  Keep all follow-up visits as told by your health care provider. This is important. Contact a health care provider if:  You have a cough  or hiccups that do not go away.  You have severe swelling in your legs or ankles. Get help right away if you:  Have fast or irregular heartbeats (palpitations).  Feel dizzy or light-headed.  Faint.  Have chest pain.  Have trouble breathing. These symptoms may represent a serious problem that is an emergency. Do not wait to see if the symptoms will go away. Get medical help right away. Call your local emergency services (911 in the [place] Do not drive yourself to the hospital. Summary  Pericardial effusion is a buildup of fluid around the heart. The fluid can eventually prevent the heart from pumping enough blood (cardiac tamponade), which can be life-threatening.  Pericardial effusion may not cause symptoms at first.  Treatment for pericardial effusion depends on the cause of your condition and how severe your symptoms are. In severe cases, hospital treatment or surgery may be required.  Rest as told by your health care provider. Ask  your health care provider what activities are safe for you. This information is not intended to replace advice given to you by your health care provider. Make sure you discuss any questions you have with your health care provider. Document Revised: 12/10/2018 Document Reviewed: 12/10/2018 Elsevier Patient Education  [employer].  ------------------------------------------ Information on my medicine - ELIQUIS (apixaban)  Why was Eliquis prescribed for you? Eliquis was prescribed for you to reduce the risk of a blood clot forming that can cause a stroke if you have a medical condition called atrial fibrillation (a type of irregular heartbeat).  What do You need to know about Eliquis ? Take your Eliquis TWICE DAILY - one tablet in the morning and one tablet in the evening with or without food. If you have difficulty swallowing the tablet whole please discuss with your pharmacist how to take the medication safely.  Take Eliquis exactly as  prescribed by your doctor and DO NOT stop taking Eliquis without talking to the doctor who prescribed the medication.  Stopping may increase your risk of developing a stroke.  Refill your prescription before you run out.  After discharge, you should have regular check-up appointments with your healthcare provider that is prescribing your Eliquis.  In the future your dose may need to be changed if your kidney function or weight changes by a significant amount or as you get older.  What do you do if you miss a dose? If you miss a dose, take it as soon as you remember on the same day and resume taking twice daily.  Do not take more than one dose of ELIQUIS at the same time to make up a missed dose.  Important Safety Information A possible side effect of Eliquis is bleeding. You should call your healthcare provider right away if you experience any of the following: ? Bleeding from an injury or your nose that does not stop. ? Unusual colored urine (red or dark brown) or unusual colored stools (red or black). ? Unusual bruising for unknown reasons. ? A serious fall or if you hit your head (even if there is no bleeding).  Some medicines may interact with Eliquis and might increase your risk of bleeding or clotting while on Eliquis. To help avoid this, consult your healthcare provider or pharmacist prior to using any new prescription or non-prescription medications, including herbals, vitamins, non-steroidal anti-inflammatory drugs (NSAIDs) and supplements.  This website has more information on Eliquis (apixaban): http://[email or web address].com/eliquis/home

## 2019-12-13 NOTE — Progress Notes (Signed)
Progress Note  Patient Name: Keith Nelson Date of Encounter: 12/13/2019  Primary Cardiologist: Berniece Salines, DO   Subjective   Ongoing SOB  Inpatient Medications    Scheduled Meds: . acetaminophen  1,000 mg Oral Q6H   Or  . acetaminophen (TYLENOL) oral liquid 160 mg/5 mL  1,000 mg Oral Q6H  . apixaban  2.5 mg Oral BID  . bisacodyl  10 mg Oral Daily  . Chlorhexidine Gluconate Cloth  6 each Topical Daily  . diltiazem  30 mg Oral Q6H  . insulin aspart  0-24 Units Subcutaneous TID AC & HS  . levothyroxine  112 mcg Oral Q0600  . mouth rinse  15 mL Mouth Rinse BID  . senna-docusate  1 tablet Oral QHS  . tamsulosin  0.4 mg Oral Daily   Continuous Infusions:  PRN Meds: lidocaine (PF), ondansetron (ZOFRAN) IV, traMADol   Vital Signs    Vitals:   12/12/19 2115 12/12/19 2200 12/13/19 0040 12/13/19 0502  BP: 139/66 (!) 121/99 (!) 150/66 140/66  Pulse: 82 62 67 68  Resp: '18 20 18 20  ' Temp: 98.2 F (36.8 C)  98.5 F (36.9 C) 98.5 F (36.9 C)  TempSrc: Oral  Oral Oral  SpO2: 93% 95% 96% 96%  Weight:    85 kg  Height:        Intake/Output Summary (Last 24 hours) at 12/13/2019 1111 Last data filed at 12/13/2019 0950 Gross per 24 hour  Intake 240 ml  Output 2100 ml  Net -1860 ml   Last 3 Weights 12/13/2019 12/11/2019 12/10/2019  Weight (lbs) 187 lb 6.3 oz 190 lb 7.6 oz 195 lb 5.2 oz  Weight (kg) 85 kg 86.4 kg 88.6 kg      Telemetry    SR, occasioanl bradycardia - Personally Reviewed  ECG    n/a - Personally Reviewed  Physical Exam   GEN: No acute distress.   Neck: No JVD Cardiac: RRR, no murmurs, rubs, or gallops.  Respiratory: coarse bilaterall GI: Soft, nontender, non-distended  MS: No edema; No deformity. Neuro:  Nonfocal  Psych: Normal affect   Labs    High Sensitivity Troponin:   Recent Labs  Lab 12/07/19 1042  TROPONINIHS 65*      Chemistry Recent Labs  Lab 12/07/19 0110 12/09/19 0335 12/10/19 0425 12/10/19 0425 12/11/19 0824  12/12/19 0242 12/13/19 0616  NA 135   < > 131*   < > 134* 135 135  K 5.3*   < > 5.1   < > 3.7 3.8 4.1  CL 99   < > 94*   < > 96* 96* 97*  CO2 26   < > 28   < > '30 27 26  ' GLUCOSE 142*   < > 108*   < > 177* 101* 86  BUN 39*   < > 45*   < > 40* 39* 31*  CREATININE 2.08*   < > 1.95*   < > 1.77* 1.61* 1.66*  CALCIUM 8.3*   < > 8.7*   < > 8.6* 8.5* 8.5*  PROT 6.2*  --  6.5  --   --   --   --   ALBUMIN 2.7*  --  2.6*  --   --   --   --   AST 17  --  27  --   --   --   --   ALT 11  --  21  --   --   --   --  ALKPHOS 54  --  69  --   --   --   --   BILITOT 1.3*  --  0.7  --   --   --   --   GFRNONAA 28*   < > 30*   < > 34* 38* 37*  GFRAA 32*   < > 35*   < > 39* 44* 42*  ANIONGAP 10   < > 9   < > '8 12 12   ' < > = values in this interval not displayed.     Hematology Recent Labs  Lab 12/11/19 0251 12/12/19 0242 12/13/19 0616  WBC 9.7 8.5 7.9  RBC 4.24 4.11* 4.06*  HGB 11.9* 11.7* 11.4*  HCT 38.3* 36.7* 36.6*  MCV 90.3 89.3 90.1  MCH 28.1 28.5 28.1  MCHC 31.1 31.9 31.1  RDW 14.8 14.8 14.9  PLT 251 252 263    BNPNo results for input(s): BNP, PROBNP in the last 168 hours.   DDimer No results for input(s): DDIMER in the last 168 hours.   Radiology    No results found.  Cardiac Studies     Patient Profile     86 y.o. male admitted with chest pain and SOB to [facility], hypoxia.   Assessment & Plan    1. Pericardial effusion - history of prior pericardial window 2019 - 12/06/19 echo large effusion, evidence of tamponade physiology by echo as well as clinical tamponade - 12/06/19 pericardiocentsis, removed 881m with resolution of clinical tamponade.  - 12/08/19 repeat pericardial windown by Dr LKipp Brood - effusion: pericardial bx midlly inflamed fibroadipose tissue, no malignancy.  - Gram stain mod WBCs, neg culture - 39,000 nucleated cells - cytology no malignancy PH 7.1 LD 2443 prot 5.3  - 12/2019 CT chest no malignancy - CRP 35 ESR 53   2. Pleural  effusion - left sided, not large enough for thoracentesis  3. Afib  - issues with afib with RVR this admission - initially started on dilt gtt then converted to oral 353mevery 6 hours, initially on heparin then transitioned to oral eliquis  - some bradycardia on oral dilt, doses held. Change to 3012mid.    4. Hypoxia - pleural effusion not large enough to tap - has been doing pulmonary toilet -  5. AKI - Cr was 2.1 on admission - 06/2018 Cr 1.33   6. Acute on chronic diastolic HF - neg 1.4 L yesterday, neg 4.1 L since admission. Has been getting IV lasix 39m77mce daily for last few days - ongonig SOB, recheck CXR and add on BNP to labs - dose IV lasix 60mg49m today  For questions or updates, please contact [facility] consult [email or web address] for contact info under        Signed, BrancCarlyle Dolly 12/13/2019, 11:11 AM

## 2019-12-14 LAB — GLUCOSE, CAPILLARY
Glucose-Capillary: 86 mg/dL (ref 70–99)
Glucose-Capillary: 97 mg/dL (ref 70–99)
Glucose-Capillary: 153 mg/dL — ABNORMAL HIGH (ref 70–99)

## 2019-12-14 MED ORDER — FUROSEMIDE 10 MG/ML IJ SOLN
60.0000 mg | Freq: Once | INTRAMUSCULAR | Status: AC
  Administered 2019-12-14: 60 mg via INTRAVENOUS
  Filled 2019-12-14: qty 6

## 2019-12-14 MED ORDER — DILTIAZEM HCL 60 MG PO TABS
30.0000 mg | ORAL_TABLET | Freq: Once | ORAL | Status: DC
  Filled 2019-12-14: qty 1

## 2019-12-14 NOTE — Progress Notes (Signed)
Progress Note  Patient Name: Davied Nocito Date of Encounter: 12/14/2019  Primary Cardiologist: Berniece Salines, DO   Subjective   No complaints  Inpatient Medications    Scheduled Meds: . apixaban  2.5 mg Oral BID  . bisacodyl  10 mg Oral Daily  . Chlorhexidine Gluconate Cloth  6 each Topical Daily  . diltiazem  30 mg Oral BID  . insulin aspart  0-24 Units Subcutaneous TID AC & HS  . levothyroxine  112 mcg Oral Q0600  . mouth rinse  15 mL Mouth Rinse BID  . senna-docusate  1 tablet Oral QHS  . tamsulosin  0.4 mg Oral Daily   Continuous Infusions:  PRN Meds: lidocaine (PF), ondansetron (ZOFRAN) IV, traMADol   Vital Signs    Vitals:   12/13/19 1339 12/13/19 1803 12/13/19 2121 12/14/19 0339  BP: 134/72 110/62 110/68 (!) 144/64  Pulse: 66  68 61  Resp: '16  20 17  ' Temp: 98.7 F (37.1 C)  98.5 F (36.9 C) 98.5 F (36.9 C)  TempSrc: Oral  Oral Oral  SpO2: 96%  96% 96%  Weight:    84.8 kg  Height:        Intake/Output Summary (Last 24 hours) at 12/14/2019 1041 Last data filed at 12/14/2019 0414 Gross per 24 hour  Intake 240 ml  Output 2000 ml  Net -1760 ml   Last 3 Weights 12/14/2019 12/13/2019 12/11/2019  Weight (lbs) 186 lb 15.2 oz 187 lb 6.3 oz 190 lb 7.6 oz  Weight (kg) 84.8 kg 85 kg 86.4 kg      Telemetry    SR - Personally Reviewed  ECG    n/a - Personally Reviewed  Physical Exam   GEN: No acute distress.   Neck: No JVD Cardiac: RRR, no murmurs, rubs, or gallops.  Respiratory: bilateral crackles GI: Soft, nontender, non-distended  MS: No edema; No deformity. Neuro:  Nonfocal  Psych: Normal affect   Labs    High Sensitivity Troponin:   Recent Labs  Lab 12/07/19 1042  TROPONINIHS 65*      Chemistry Recent Labs  Lab 12/10/19 0425 12/10/19 0425 12/11/19 0824 12/12/19 0242 12/13/19 0616  NA 131*   < > 134* 135 135  K 5.1   < > 3.7 3.8 4.1  CL 94*   < > 96* 96* 97*  CO2 28   < > '30 27 26  ' GLUCOSE 108*   < > 177* 101* 86  BUN 45*    < > 40* 39* 31*  CREATININE 1.95*   < > 1.77* 1.61* 1.66*  CALCIUM 8.7*   < > 8.6* 8.5* 8.5*  PROT 6.5  --   --   --   --   ALBUMIN 2.6*  --   --   --   --   AST 27  --   --   --   --   ALT 21  --   --   --   --   ALKPHOS 69  --   --   --   --   BILITOT 0.7  --   --   --   --   GFRNONAA 30*   < > 34* 38* 37*  GFRAA 35*   < > 39* 44* 42*  ANIONGAP 9   < > '8 12 12   ' < > = values in this interval not displayed.     Hematology Recent Labs  Lab 12/11/19 8676 12/12/19 7209 12/13/19 4709  WBC 9.7 8.5 7.9  RBC 4.24 4.11* 4.06*  HGB 11.9* 11.7* 11.4*  HCT 38.3* 36.7* 36.6*  MCV 90.3 89.3 90.1  MCH 28.1 28.5 28.1  MCHC 31.1 31.9 31.1  RDW 14.8 14.8 14.9  PLT 251 252 263    BNP Recent Labs  Lab 12/13/19 0616  BNP 194.1*     DDimer No results for input(s): DDIMER in the last 168 hours.   Radiology    DG CHEST PORT 1 VIEW  Result Date: 12/13/2019 CLINICAL DATA:  Shortness of breath EXAM: PORTABLE CHEST 1 VIEW COMPARISON:  Chest radiograph dated 12/10/2019 FINDINGS: The heart is enlarged. Vascular calcifications are seen in the aortic arch. A small left pleural effusion with associated atelectasis/airspace disease appears decreased since 12/10/2019. Mild right basilar atelectasis is noted. There is no pneumothorax. There is no right pleural effusion. The visualized skeletal structures are unremarkable. A pericardial drain has been removed. IMPRESSION: 1. Decreased small left pleural effusion with associated atelectasis/airspace disease. Electronically Signed   By: Zerita Boers M.D.   On: 12/13/2019 15:05    Cardiac Studies     Patient Profile     86 y.o. male admitted with recurrent pericardial effusion with tamponade s/p pericardiocentesis and repeart pericardial window.   Assessment & Plan    1. Pericardial effusion - history of prior pericardial window 2019 - 12/06/19 echo large effusion, evidence of tamponade physiology by echo as well as clinical tamponade - 12/06/19  pericardiocentsis, removed 858m with resolution of clinical tamponade.  - 12/08/19 repeat pericardial windown by Dr LKipp Brood - effusion: pericardial bx midlly inflamed fibroadipose tissue, no malignancy.  - Gram stain mod WBCs, neg culture - 39,000 nucleated cells - cytology no malignancy PH 7.1 LD 2443 prot 5.3  - 12/2019 CT chest no malignancy - CRP 35 ESR 53   2. Pleural effusion - left sided, not large enough for thoracentesis  3. Afib  - issues with afib with RVR this admission - initially started on dilt gtt then converted to oral 338mevery 6 hours, initially on heparin then transitioned to oral eliquis  - some bradycardia on oral dilt, 303m6hrs, changed to bid dosing yesterday. Rates improved   4. Hypoxia - ongoing high O2 requirement. At rest on 3L O2 sats 95%, with ambulation down to 82%.  - pleural effusion not large enough to tap - has been doing pulmonary toilet - cxr yesterday with decreased left pleural effusion and associated atelectasis - BNP was 194  5. AKI - Cr was 2.1 on admission - 06/2018 Cr 1.33   6. Acute on chronic diastolic HF - 1.9 L yesterday, negative 6 L since admission. Overall downtrend in Cr with diuresis though slight uptrend yesterday. Received IV lasix 37m8m1 yesterday - CXR with smaller pleural effusion yesterday, BNP 194 - unknown baselien weight, from 11/19 discharge was 170 lbs. Today 180 lbs  - ongoing hypoxia as reported above I think fluid is playnig some role. Continue diuresis today, redose IV lasix 37mg13m.      For questions or updates, please contact [facility] consult [email or web address] for contact info under        Signed, BrancCarlyle Dolly 12/14/2019, 10:41 AM

## 2019-12-14 NOTE — Progress Notes (Signed)
Pt had incontinent stool sitting in chair and got upset. HR 170's, pt asymptomatic. Pt given cardizem and returned to bed. HR down to 120-140's when back in bed. Cont to monitor. Emelda Brothers RN

## 2019-12-14 NOTE — Progress Notes (Signed)
Spoke with Dr. Noemi Chapel about HR 160's Afib, asymptomatic BP 112/81. New orders given. Emelda Brothers RN

## 2019-12-15 ENCOUNTER — Encounter (HOSPITAL_COMMUNITY): Payer: Self-pay | Admitting: Internal Medicine

## 2019-12-15 DIAGNOSIS — J449 Chronic obstructive pulmonary disease, unspecified: Secondary | ICD-10-CM

## 2019-12-15 DIAGNOSIS — N170 Acute kidney failure with tubular necrosis: Secondary | ICD-10-CM

## 2019-12-15 DIAGNOSIS — J9621 Acute and chronic respiratory failure with hypoxia: Secondary | ICD-10-CM

## 2019-12-15 LAB — BASIC METABOLIC PANEL
Anion gap: 12 (ref 5–15)
BUN: 28 mg/dL — ABNORMAL HIGH (ref 8–23)
CO2: 28 mmol/L (ref 22–32)
Calcium: 8.9 mg/dL (ref 8.9–10.3)
Chloride: 95 mmol/L — ABNORMAL LOW (ref 98–111)
Creatinine, Ser: 1.68 mg/dL — ABNORMAL HIGH (ref 0.61–1.24)
GFR calc Af Amer: 42 mL/min — ABNORMAL LOW (ref >60)
GFR calc non Af Amer: 36 mL/min — ABNORMAL LOW (ref >60)
Glucose, Bld: 152 mg/dL — ABNORMAL HIGH (ref 70–99)
Potassium: 3.9 mmol/L (ref 3.5–5.1)
Sodium: 135 mmol/L (ref 135–145)

## 2019-12-15 LAB — GLUCOSE, CAPILLARY
Glucose-Capillary: 92 mg/dL (ref 70–99)
Glucose-Capillary: 88 mg/dL (ref 70–99)
Glucose-Capillary: 114 mg/dL — ABNORMAL HIGH (ref 70–99)
Glucose-Capillary: 99 mg/dL (ref 70–99)

## 2019-12-15 MED ORDER — DILTIAZEM HCL 60 MG PO TABS
30.0000 mg | ORAL_TABLET | Freq: Three times a day (TID) | ORAL | Status: DC
  Administered 2019-12-15 – 2019-12-16 (×4): 30 mg via ORAL
  Filled 2019-12-15 (×3): qty 1

## 2019-12-15 NOTE — Progress Notes (Signed)
Progress Note  Patient Name: Keith Nelson Date of Encounter: 12/15/2019  Primary Cardiologist: Berniece Salines, DO  Patient Profile     86 y.o. male admitted with recurrent pericardial effusion with tamponade s/p pericardiocentesis (12/06/2019) and secondary pericardial window (12/08/2019).  Prior history of transudate if pericardial effusion in October 2019-underwent pericardial window.  Subjective   Feeling little better.  Breathing is pretty good.  He does say that he is on home oxygen mostly at night, sometimes worse later in the day.  He does not do a lot of ambulation or activities at home.  His caregiver is his younger brother.  He says he does a lot of riding around in the car with his brother.  In the past he has declined home health.  Currently on low-flow oxygen 2-3 L.  No signs of irregular heartbeats.  No chest pain.  Was noted to be in A. fib RVR in the 160s overnight, roughly 1928 converted to sinus rhythm.  Inpatient Medications    Scheduled Meds: . apixaban  2.5 mg Oral BID  . bisacodyl  10 mg Oral Daily  . Chlorhexidine Gluconate Cloth  6 each Topical Daily  . diltiazem  30 mg Oral BID  . diltiazem  30 mg Oral Once  . insulin aspart  0-24 Units Subcutaneous TID AC & HS  . levothyroxine  112 mcg Oral Q0600  . mouth rinse  15 mL Mouth Rinse BID  . senna-docusate  1 tablet Oral QHS  . tamsulosin  0.4 mg Oral Daily   Continuous Infusions:  PRN Meds: lidocaine (PF), ondansetron (ZOFRAN) IV, traMADol   Vital Signs    Vitals:   12/14/19 1242 12/14/19 2121 12/15/19 0119 12/15/19 0440  BP: 124/64 124/68 130/68 129/71  Pulse: 63 62  76  Resp: 19 19  20   Temp: 98.1 F (36.7 C) 98.1 F (36.7 C) 98 F (36.7 C) 98.5 F (36.9 C)  TempSrc: Oral Oral Oral Oral  SpO2: 94% 95%    Weight:    82.9 kg  Height:        Intake/Output Summary (Last 24 hours) at 12/15/2019 0849 Last data filed at 12/15/2019 0727 Gross per 24 hour  Intake 240 ml  Output 2700 ml  Net  -2460 ml   Last 3 Weights 12/15/2019 12/14/2019 12/13/2019  Weight (lbs) 182 lb 12.8 oz 186 lb 15.2 oz 187 lb 6.3 oz  Weight (kg) 82.918 kg 84.8 kg 85 kg      Telemetry    Converted from A. fib with rates as high as 160 bpm-conversion was roughly 1940. NSR, rate in the 60s.- Personally Reviewed  ECG    No EKG- Personally Reviewed  Physical Exam   GEN:  Pleasant elderly gentleman.  Sitting up in the recliner.  No acute distress.   Neck: No JVD or bruit. Cardiac: RRR, no murmurs, rubs, or gallops.  Respiratory:  Bibasal crackles but no obvious rales.  Faint wheezing GI: Soft, nontender, non-distended  MS: No edema; No deformity. Neuro:  Nonfocal ; A&O x3 Psych: Normal affect   Labs    High Sensitivity Troponin:   Recent Labs  Lab 12/07/19 1042  TROPONINIHS 65*      Chemistry Recent Labs  Lab 12/10/19 0425 12/10/19 0425 12/11/19 0824 12/12/19 0242 12/13/19 0616  NA 131*   < > 134* 135 135  K 5.1   < > 3.7 3.8 4.1  CL 94*   < > 96* 96* 97*  CO2 28   < >  30 27 26   GLUCOSE 108*   < > 177* 101* 86  BUN 45*   < > 40* 39* 31*  CREATININE 1.95*   < > 1.77* 1.61* 1.66*  CALCIUM 8.7*   < > 8.6* 8.5* 8.5*  PROT 6.5  --   --   --   --   ALBUMIN 2.6*  --   --   --   --   AST 27  --   --   --   --   ALT 21  --   --   --   --   ALKPHOS 69  --   --   --   --   BILITOT 0.7  --   --   --   --   GFRNONAA 30*   < > 34* 38* 37*  GFRAA 35*   < > 39* 44* 42*  ANIONGAP 9   < > 8 12 12    < > = values in this interval not displayed.     Hematology Recent Labs  Lab 12/11/19 0251 12/12/19 0242 12/13/19 0616  WBC 9.7 8.5 7.9  RBC 4.24 4.11* 4.06*  HGB 11.9* 11.7* 11.4*  HCT 38.3* 36.7* 36.6*  MCV 90.3 89.3 90.1  MCH 28.1 28.5 28.1  MCHC 31.1 31.9 31.1  RDW 14.8 14.8 14.9  PLT 251 252 263    BNP Recent Labs  Lab 12/13/19 0616  BNP 194.1*     DDimer No results for input(s): DDIMER in the last 168 hours.   Radiology     CT chest 12/07/2019: Moderate interval  improvement in pericardial fusion post pericardial drain small residual pericardial effusion.  Worsening bilateral pleural effusions with dependent atelectasis.  Aortic atherosclerosis and atherosclerotic coronary disease.   DG CHEST PORT 1 VIEW  Result Date: 12/13/2019 CLINICAL DATA:  Shortness of breath EXAM: PORTABLE CHEST 1 VIEW COMPARISON:  Chest radiograph dated 12/10/2019 FINDINGS: The heart is enlarged. Vascular calcifications are seen in the aortic arch. A small left pleural effusion with associated atelectasis/airspace disease appears decreased since 12/10/2019. Mild right basilar atelectasis is noted. There is no pneumothorax. There is no right pleural effusion. The visualized skeletal structures are unremarkable. A pericardial drain has been removed. IMPRESSION: 1. Decreased small left pleural effusion with associated atelectasis/airspace disease. Electronically Signed   By: 02/09/2020 M.D.   On: 12/13/2019 15:05    Cardiac Studies    12/06/2019: Large pericardial effusion present.  Circumferential.  Excessive respiratory variation in the mitral valve Doppler velocities and septal motion consistent with tamponade physiology.--Urgent paracentesis a window indicated.  Cannot assess LVEF with heart rate of 160 bpm.  --Pulses paradoxus measured at 24 mmHg.  Also pulses alternanas noted on telemetry  12/06/2019-subxiphoid approach pericardiocentesis -> 800 mL removed.  12/06/2019-Limited echo post pericardiocentesis 850 mL dark serous fluid.  Minimal residual pericardial effusion.   12/08/2019: Right RATS (robot-assisted right video thorascopy) pericardial window   Assessment & Plan    1.  Recurrent pericardial effusion - history of prior pericardial window October 2019 now readmitted with large pericardial effusion requiring rescue paracentesis followed by pericardial window.  Biopsy revealed inflamed fibroadipose tissue with no malignancy.  Negative Gram stain.  Transudative.    CT  scan showed no evidence of malignancy.  Likely consider recheck echocardiogram in 2 to 3 weeks post discharge.  2.  Left-sided pleural effusion;  not large enough for thoracentesis  Effusion was decreased as of 12/13/2019.  Continue supplemental oxygen and pulmonary toilet  3. Afib- issues with afib with RVR this admission  Became hypotensive with diltiazem 60 mg every 6 hours, was changed to twice daily dosing on Saturday 5/8. ->  Has now spontaneously converted back to sinus rhythm rates in the 60s.  Plan will be to increase diltiazem to 30 mg every 8 hours with hopes to consolidate to 120 mg XT. This patients CHA2DS2-VASc Score and unadjusted Ischemic Stroke Rate (% per year) is equal to 7.2 % stroke rate/year from a score of 5 Above score calculated as 1 point each if present [CHF, HTN, DM, Vascular=MI/PAD/Aortic Plaque, Age if 65-74, or Male] Above score calculated as 2 points each if present [Age > 75, or Stroke/TIA/TE]  On low-dose apixaban based on age and renal function.   4.  Chronic hypoxic respiratory failure: Related to COPD with chronic O2 use at home but also now with pleural effusion. - ongoing high O2 requirement. At rest on 3L O2 sats 95%, with ambulation down to 82%.-->  Will need to record prior to discharge count, may recommend full-time oxygen for short period. - pleural effusion not large enough to tap  Continue pulmonary toilet and incentive spirometry for atelectasis.  Has been doing pulmonary toilet  - BNP was 194 - > has been diuresed relatively well.  5. AKI: - Cr was 2.1 on admission - 06/2018 Cr 1.33 ->   has now been stable roughly 1.6-1.7.   6. Acute on chronic diastolic HF  Was increased from 40 IV Lasix to 60 mg IV Lasix on 5/8 --> (had previously had 1.9 L out on 5/8 with total 6 L) after increased diuretic, out 1970 mL almost 8 L total  since admission.  He is apparently back to his baseline dry weight.  We can back off Lasix now. 1.9  L yesterday, negative 6 L since admission. Overall downtrend in Cr with diuresis though slight uptrend yesterday. Received IV lasix 60mg  x 1 yesterday - CXR with smaller pleural effusion yesterday, BNP 194 - unknown baselien weight, from 11/19 discharge was 170 lbs. Today 180 lbs  With improved dyspnea, will hold off on diuretic today and start low-dose oral Lasix for discharge.   Patient needs to ambulate and be evaluated by physical therapy.  Need to document oxygen requirement with plans to hopefully discharge tomorrow if possible.   For questions or updates, please contact [facility] Please consult [email or web address] for contact info under        Signed, 12/19, MD  12/15/2019, 8:49 AM

## 2019-12-15 NOTE — Evaluation (Signed)
Physical Therapy Evaluation Patient Details Name: Kazimierz Springborn MRN: [id number] DOB: 11-16-1929 Today's Date: 12/15/2019   History of Present Illness  Trevel Dillenbeck is a 86 y.o. male with medical history significant of hypertension, CKD stage III, arthritis, hypothyroidism, COPD, chronic hypoxemic respiratory failure on 2 L of oxygen at home only at nighttime is a direct transfer from [facility] for pericardial effusion.  Clinical Impression  Pt is at or close to baseline functioning and should be safe at home with available assist of his brother. There are no further acute PT needs.  Will sign off at this time.    Follow Up Recommendations No PT follow up    Equipment Recommendations  None recommended by PT    Recommendations for Other Services       Precautions / Restrictions Precautions Precautions: Fall      Mobility  Bed Mobility               General bed mobility comments: NT, up in chair on arrival  Transfers Overall transfer level: Modified independent                  Ambulation/Gait Ambulation/Gait assistance: Modified independent (Device/Increase time) Gait Distance (Feet): 500 Feet Assistive device: None Gait Pattern/deviations: Step-through pattern   Gait velocity interpretation: 1.31 - 2.62 ft/sec, indicative of limited community ambulator General Gait Details: shorter step length, generally steady, able to change speed significantly, scan around and back up without significant deviation/.  Stairs Stairs: Yes Stairs assistance: Supervision;Modified independent (Device/Increase time) Stair Management: One rail Left;Alternating pattern;Forwards Number of Stairs: 2 General stair comments: safe with the rail.  Pulls up by using the rail  Wheelchair Mobility    Modified Rankin (Stroke Patients Only)       Balance Overall balance assessment: Needs assistance   Sitting balance-Leahy Scale: Good       Standing  balance-Leahy Scale: Good                               Pertinent Vitals/Pain Pain Assessment: No/denies pain    Home Living Family/patient expects to be discharged to:: Private residence Living Arrangements: Other relatives Available Help at Discharge: Family;Available PRN/intermittently Type of Home: House Home Access: Stairs to enter Entrance Stairs-Rails: Chemical engineer of Steps: 2 Home Layout: One level Home Equipment: Other (comment)(TBA)      Prior Function Level of Independence: Independent               Hand Dominance        Extremity/Trunk Assessment   Upper Extremity Assessment Upper Extremity Assessment: Overall WFL for tasks assessed    Lower Extremity Assessment Lower Extremity Assessment: Overall WFL for tasks assessed    Cervical / Trunk Assessment Cervical / Trunk Assessment: Kyphotic  Communication   Communication: No difficulties  Cognition Arousal/Alertness: Awake/alert Behavior During Therapy: WFL for tasks assessed/performed Overall Cognitive Status: Within Functional Limits for tasks assessed                                        General Comments General comments (skin integrity, edema, etc.): SpO2 on 4L 98%, sats during gait on RA maintained generally at 89/90%, but dipped to 88% on occasion and 87% ond time with quick recovery.  HR was maintained in the 80's throughout.    Exercises  Assessment/Plan    PT Assessment Patent does not need any further PT services  PT Problem List         PT Treatment Interventions      PT Goals (Current goals can be found in the Care Plan section)  Acute Rehab PT Goals Patient Stated Goal: get home PT Goal Formulation: All assessment and education complete, DC therapy Potential to Achieve Goals: Good    Frequency     Barriers to discharge        Co-evaluation               AM-PAC PT "6 Clicks" Mobility  Outcome Measure Help  needed turning from your back to your side while in a flat bed without using bedrails?: None Help needed moving from lying on your back to sitting on the side of a flat bed without using bedrails?: None Help needed moving to and from a bed to a chair (including a wheelchair)?: None Help needed standing up from a chair using your arms (e.g., wheelchair or bedside chair)?: None Help needed to walk in hospital room?: None Help needed climbing 3-5 steps with a railing? : None 6 Click Score: 24    End of Session   Activity Tolerance: Patient tolerated treatment well Patient left: in chair;with call bell/phone within reach Nurse Communication: Mobility status PT Visit Diagnosis: Unsteadiness on feet (R26.81)    Time: 4854-6270 PT Time Calculation (min) (ACUTE ONLY): 21 min   Charges:   PT Evaluation $PT Eval Moderate Complexity: 1 Mod          12/15/2019  Jacinto Halim., PT Acute Rehabilitation Services [phone]  (pager) [phone]  (office)  Eliseo Gum Gearld Kerstein 12/15/2019, 6:51 PM

## 2019-12-16 DIAGNOSIS — N183 Chronic kidney disease, stage 3 unspecified: Secondary | ICD-10-CM

## 2019-12-16 LAB — GLUCOSE, CAPILLARY
Glucose-Capillary: 95 mg/dL (ref 70–99)
Glucose-Capillary: 118 mg/dL — ABNORMAL HIGH (ref 70–99)
Glucose-Capillary: 99 mg/dL (ref 70–99)

## 2019-12-16 LAB — BASIC METABOLIC PANEL
Anion gap: 10 (ref 5–15)
BUN: 26 mg/dL — ABNORMAL HIGH (ref 8–23)
CO2: 30 mmol/L (ref 22–32)
Calcium: 8.9 mg/dL (ref 8.9–10.3)
Chloride: 96 mmol/L — ABNORMAL LOW (ref 98–111)
Creatinine, Ser: 1.64 mg/dL — ABNORMAL HIGH (ref 0.61–1.24)
GFR calc Af Amer: 43 mL/min — ABNORMAL LOW (ref >60)
GFR calc non Af Amer: 37 mL/min — ABNORMAL LOW (ref >60)
Glucose, Bld: 99 mg/dL (ref 70–99)
Potassium: 3.6 mmol/L (ref 3.5–5.1)
Sodium: 136 mmol/L (ref 135–145)

## 2019-12-16 MED ORDER — DILTIAZEM HCL ER COATED BEADS 120 MG PO CP24
120.0000 mg | ORAL_CAPSULE | Freq: Every day | ORAL | Status: DC
  Administered 2019-12-16: 120 mg via ORAL
  Filled 2019-12-16: qty 1

## 2019-12-16 MED ORDER — FUROSEMIDE 20 MG PO TABS: 20.0000 mg | ORAL_TABLET | ORAL | 0 refills | Status: DC

## 2019-12-16 MED ORDER — APIXABAN 2.5 MG PO TABS: 2.5000 mg | ORAL_TABLET | Freq: Two times a day (BID) | ORAL | 0 refills | Status: DC

## 2019-12-16 MED ORDER — DILTIAZEM HCL ER COATED BEADS 120 MG PO CP24: 120.0000 mg | ORAL_CAPSULE | Freq: Every day | ORAL | 3 refills | Status: DC

## 2019-12-16 MED ORDER — FUROSEMIDE 20 MG PO TABS: 20.0000 mg | ORAL_TABLET | ORAL | 1 refills | Status: DC

## 2019-12-16 MED ORDER — APIXABAN 2.5 MG PO TABS: 2.5000 mg | ORAL_TABLET | Freq: Two times a day (BID) | ORAL | 5 refills | Status: DC

## 2019-12-16 MED ORDER — POTASSIUM CHLORIDE CRYS ER 20 MEQ PO TBCR
40.0000 meq | EXTENDED_RELEASE_TABLET | Freq: Once | ORAL | Status: AC
  Administered 2019-12-16: 40 meq via ORAL
  Filled 2019-12-16: qty 2

## 2019-12-16 MED ORDER — FUROSEMIDE 20 MG PO TABS: 20.0000 mg | ORAL_TABLET | ORAL | Status: DC

## 2019-12-16 NOTE — Care Management (Signed)
1357 12-16-19 Patient has a portable oxygen tank in the room for travel home. Patient states he lives with his brother and has a Insurance underwriter for oxygen in the home. No further needs from Case Manager at this time. Gala Lewandowsky, RN,BSN Case Manager

## 2019-12-16 NOTE — Discharge Summary (Addendum)
Discharge Summary    Patient ID: Keith Nelson MRN: [id number]; DOB: [birth year]  Admit date: 12/06/2019 Discharge date: 12/16/2019  Primary Care Provider: [name], NP  Primary Cardiologist: Thomasene Ripple, DO  Primary Electrophysiologist:  None   Discharge Diagnoses    Principal Problem:   Pericardial effusion Active Problems:   Essential hypertension   COPD (chronic obstructive pulmonary disease) (HCC)   Pericardial effusion with cardiac tamponade   Paroxysmal atrial fibrillation with RVR (HCC)   Hypothyroidism   CKD (chronic kidney disease), stage III   Acute on chronic respiratory failure with hypoxia [facility])    Diagnostic Studies/Procedures    Pericardiocentesis 12/06/2019 Successful pericardiocentesis for cardiac tamponade via the subxiphoid approach with removal of 850 cc of brownish straw-colored fluid sent for analysis.  Preprocedure, the patient's pressure was 93/60, pulsus paradox was present approximating 24 mmHg and there was suggestion of pulses alternations on telemetry.  Following thoracentesis pressure increased to 123/68, shortnessness of breath improved, and there was resolution of pulsus alternans on monitoring.\   _____________   History of Present Illness     Keith Nelson is a 86 y.o. male with past medical history of hypertension, CKD stage III, arthritis, hypothyroidism, COPD, chronic hypoxic respiratory failure on 2 L home O2 who was transferred from [facility] for pericardial effusion.  CT angiogram of the chest showed no evidence of PE, moderate severity atelectasis and large predominantly stable pericardial effusion.  Myoview was negative for reversible ischemia or infarction, EF 53%.  He was admitted on 5/1 with recurrent tamponade for which he underwent emergent pericardiocentesis  Initially presented 10/ 19  and found to have transudative effusion, cytology neg for malignancy; reactive cells present and TSH elevated.  Afib assoc  with drain in place.  Recurrent accumulation 11/19  >> window by BB w fluid reaccumulation 12/19 w/o symptoms  Fluid collection >> 39K nucleated cell ( 235 11/19)  No organisms seen  Other labs pending   Hospital Course     Consultants: ID and CVTS  Post transfer, echocardiogram was obtained on 12/06/2019 that showed large circumferential pericardial effusion with maximum diameter of 3 cm posteriorly, there were signs of tamponade with greater than than 20% respiratory variability on mitral inflow, poor RV filling and dilated IVC.  LVEF could not be assessed as the heart rate was 160 bpm.  Patient was taken urgently to the Cath Lab on 12/06/2019 and underwent successful pericardiocentesis for cardiac tamponade via subxiphoid approach with removal of 850 cc of brownish straw-colored fluid. Biopsy revealed inflamed fibroadipose tissue with no malignancy.  Gram stain was negative.  CT scan showed no evidence of malignancy.  Preprocedure, his blood pressure was 93/60.  Pulses paradoxus was present.  Following the pericardiocentesis, blood pressure improved to 123/68.  Shortness of breath has also improved as well.  Postprocedure, limited echocardiogram showed minimal residual pericardial effusion.  Infectious disease was consulted for exudative pericarditis.  He was started on antibiotic and treated with vancomycin and ceftriaxone.  CT surgery was consulted and the patient was taken to the OR on 12/08/2019 by Dr. Cliffton Asters and underwent robotic assisted right video thoracoscopy and pericardial window.  Chest x-ray obtained on 12/10/2019 suggested moderate left pleural effusion. This is not large enough to be tapped.  During this admission, patient had issues with A. fib with RVR, he was initially treated with IV diltiazem, this was converted to oral diltiazem prior to discharge. He converted to sinus rhythm on rate control. He was also  started on oral Eliquis.  He was weaned off of oxygen prior to discharge.  Renal  function stable.  I have arranged follow-up this Friday by Dr. Lisabeth Devoidardie Tobb in our [place] office for follow-up. He does have occasional bradycardia down to high 40s, HR is mostly normal, will need to check for symptom of dizziness on followup. Cardiac telemetry prior to discharge showed frequent PVCs and bigeminy.  May consider repeat echocardiogram in 2 to 3 weeks post discharge.  Patient will continue to use oxygen as needed at home.  During this gentlemen's admission he put out a total of 8 L.  He is back to his baseline dry weight which is 180 pounds. He will need a BMET on followup.  _____________  Discharge Vitals Blood pressure (!) 146/51, pulse 80, temperature 97.8 F (36.6 C), temperature source Oral, resp. rate 20, height 6' (1.829 m), weight 107.7 kg, SpO2 100 %.  Filed Weights   12/16/19 0438 12/16/19 0439 12/16/19 0522  Weight: 69.3 kg 82.9 kg 107.7 kg   General appearance: alert, cooperative, appears stated age and no distress Neck: no carotid bruit and no JVD Lungs: normal percussion bilaterally and Mild bibasal crackles, that clear with cough.  Nonlabored.  Otherwise good air movement. Heart: regular rate and rhythm, S1, S2 normal, no murmur, click, rub or gallop and Occasional ectopy Abdomen: soft, non-tender; bowel sounds normal; no masses,  no organomegaly Extremities: extremities normal, atraumatic, no cyanosis or edema Pulses: 2+ and symmetric Neurologic: Grossly normal   Labs & Radiologic Studies    CBC No results for input(s): WBC, NEUTROABS, HGB, HCT, MCV, PLT in the last 72 hours. Basic Metabolic Panel Recent Labs    32/44/104/05/27 0908 12/16/19 1121  NA 135 136  K 3.9 3.6  CL 95* 96*  CO2 28 30  GLUCOSE 152* 99  BUN 28* 26*  CREATININE 1.68* 1.64*  CALCIUM 8.9 8.9   Liver Function Tests No results for input(s): AST, ALT, ALKPHOS, BILITOT, PROT, ALBUMIN in the last 72 hours. No results for input(s): LIPASE, AMYLASE in the last 72 hours. High Sensitivity  Troponin:   Recent Labs  Lab 12/07/19 1042  TROPONINIHS 65*    BNP Invalid input(s): POCBNP D-Dimer No results for input(s): DDIMER in the last 72 hours. Hemoglobin A1C No results for input(s): HGBA1C in the last 72 hours. Fasting Lipid Panel No results for input(s): CHOL, HDL, LDLCALC, TRIG, CHOLHDL, LDLDIRECT in the last 72 hours. Thyroid Function Tests No results for input(s): TSH, T4TOTAL, T3FREE, THYROIDAB in the last 72 hours.  Invalid input(s): FREET3 _____________  DG Chest 1 View  Result Date: 12/09/2019 CLINICAL DATA:  Shortness of breath. EXAM: CHEST  1 VIEW COMPARISON:  Dec 08, 2019. FINDINGS: Stable cardiomegaly. Right internal jugular catheter is unchanged in position. Pericardial drain is again noted and unchanged. Moderate left pleural effusion is noted with underlying atelectasis or infiltrate. Bony thorax is unremarkable. IMPRESSION: Stable moderate left pleural effusion with underlying atelectasis or infiltrate. Stable pericardial drain is noted. Electronically Signed   By: Lupita RaiderJames  Green Jr M.D.   On: 12/09/2019 08:50   CT CHEST WO CONTRAST  Result Date: 12/07/2019 CLINICAL DATA:  Evaluate pleural effusion.  Malignancy suspected. EXAM: CT CHEST WITHOUT CONTRAST TECHNIQUE: Multidetector CT imaging of the chest was performed following the standard protocol without IV contrast. COMPARISON:  12/06/2019 FINDINGS: Cardiovascular: Borderline cardiomegaly. Moderate interval improvement patient's pericardial effusion with small residual pericardial effusion. A pericardial drain is in place with tip over the left posterior  pericardium. Calcified plaque over the left main and 3 vessel coronary arteries. Calcified plaque over the thoracic aorta which is normal in caliber. Remaining vascular structures are unremarkable. Mediastinum/Nodes: No significant mediastinal or hilar adenopathy. Remaining mediastinal structures are unremarkable. Lungs/Pleura: Lungs are adequately inflated  demonstrate interval worsening of small bilateral pleural effusions with associated basilar atelectasis. Airways are unremarkable. Upper Abdomen: Calcified plaque over the abdominal aorta. Minimal nodular contour of the left lobe of the liver. Remainder the upper abdomen is unchanged. Musculoskeletal: Unchanged. IMPRESSION: 1. Moderate interval improvement patient's pericardial effusion post placement of pericardial drain with small residual pericardial effusion. 2. Worsening bilateral small pleural effusions with associated dependent atelectasis. 3. Aortic Atherosclerosis (ICD10-I70.0). Atherosclerotic coronary artery disease. Electronically Signed   By: Marin Olp M.D.   On: 12/07/2019 13:53   CARDIAC CATHETERIZATION  Result Date: 12/06/2019 Successful pericardiocentesis for cardiac tamponade via the subxiphoid approach with removal of 850 cc of brownish straw-colored fluid sent for analysis.  Preprocedure, the patient's pressure was 93/60, pulsus paradox was present approximating 24 mmHg and there was suggestion of pulses alternations on telemetry.  Following thoracentesis pressure increased to 123/68, shortnessness of breath improved, and there was resolution of pulsus alternans on monitoring.  DG CHEST PORT 1 VIEW  Result Date: 12/13/2019 CLINICAL DATA:  Shortness of breath EXAM: PORTABLE CHEST 1 VIEW COMPARISON:  Chest radiograph dated 12/10/2019 FINDINGS: The heart is enlarged. Vascular calcifications are seen in the aortic arch. A small left pleural effusion with associated atelectasis/airspace disease appears decreased since 12/10/2019. Mild right basilar atelectasis is noted. There is no pneumothorax. There is no right pleural effusion. The visualized skeletal structures are unremarkable. A pericardial drain has been removed. IMPRESSION: 1. Decreased small left pleural effusion with associated atelectasis/airspace disease. Electronically Signed   By: Zerita Boers M.D.   On: 12/13/2019 15:05    DG CHEST PORT 1 VIEW  Result Date: 12/10/2019 CLINICAL DATA:  Shortness of breath, pericardial effusion. EXAM: PORTABLE CHEST 1 VIEW COMPARISON:  12/09/2019 and CT chest 12/07/2019. FINDINGS: Right IJ central line has been removed. Pericardial window procedure with pericardial drain in place. Cardiomediastinal silhouette is enlarged, unchanged. Thoracic aorta is calcified. Left basilar collapse/consolidation and moderate left pleural effusion, unchanged. Mild central pulmonary vascular congestion. IMPRESSION: 1. Pericardial window procedure with pericardial drain in place. Cardiopericardial silhouette is stable in size and configuration. 2. Left basilar collapse/consolidation with a moderate left pleural effusion, similar. 3. Mild central pulmonary vascular congestion. Electronically Signed   By: Lorin Picket M.D.   On: 12/10/2019 09:40   DG Chest Port 1 View  Result Date: 12/08/2019 CLINICAL DATA:  Pericardial effusion EXAM: PORTABLE CHEST 1 VIEW COMPARISON:  CT chest 12/07/2019 FINDINGS: Right jugular central venous catheter with the tip projecting over the SVC. Pericardial drain is again noted. Small bilateral pleural effusions, left greater than right. Bilateral mild interstitial thickening. No pneumothorax. Stable cardiomegaly. Thoracic aortic atherosclerosis. No acute osseous abnormality. IMPRESSION: 1. Right jugular central venous catheter with the tip projecting over the SVC. Pericardial drain is again noted. 2. Small bilateral pleural effusions, left greater than right. 3. Stable cardiomegaly with pulmonary vascular congestion. Electronically Signed   By: Kathreen Devoid   On: 12/08/2019 13:31   IR US CHEST  Result Date: 12/09/2019 CLINICAL DATA:  Pleural effusions, assess for thoracentesis EXAM: ULTRASOUND OF CHEST SOFT TISSUES TECHNIQUE: Ultrasound examination of the chest wall soft tissues was performed in the area of clinical concern. COMPARISON:  12/09/2019 FINDINGS: Ultrasound performed of  the left  posterior chest. Small effusion with left lung collapse/consolidation. Not enough fluid to warrant large volume thoracentesis. Procedure not performed. IMPRESSION: Small left effusion by ultrasound. Electronically Signed   By: Judie Petit.  Shick M.D.   On: 12/09/2019 15:58   ECHOCARDIOGRAM LIMITED  Result Date: 12/07/2019    ECHOCARDIOGRAM LIMITED REPORT   Patient Name:   REGNALD BOWENS Date of Exam: 12/06/2019 Medical Rec #:  [id number]           Height:       72.0 in Accession #:    [id number]          Weight:       180.0 lb Date of Birth:  04/11/1930          BSA:          2.037 m Patient Age:    86 years            BP:           96/68 mmHg Patient Gender: M                   HR:           161 bpm. Exam Location:  Inpatient Procedure: 2D Echo Indications:    Pericardiocentesis  History:        Patient has prior history of Echocardiogram examinations, most                 recent 12/06/2019. COPD, Arrythmias:Atrial Fibrillation; Risk                 Factors:Hypertension. CKD.  Sonographer:    Ross Ludwig RDCS (AE) Referring Phys: [id number] THOMAS A KELLY IMPRESSIONS  1. Limited echocardiogram for pericardiocentesis guidence. 850 mL of dark yellow serous fluid was removed with only minimal residual pericardial effusion. Repeat echocardiogram in 1-2 days. Tobias Alexander MD Electronically signed by Tobias Alexander MD Signature Date/Time: 12/07/2019/1:12:00 PM    Final    ECHOCARDIOGRAM LIMITED  Result Date: 12/06/2019    ECHOCARDIOGRAM LIMITED REPORT   Patient Name:   JOSEFF LUCKMAN Date of Exam: 12/06/2019 Medical Rec #:  [id number]           Height:       68.0 in Accession #:    [id number]          Weight:       180.0 lb Date of Birth:  29-Jan-1930          BSA:          1.954 m Patient Age:    86 years            BP:           96/68 mmHg Patient Gender: M                   HR:           161 bpm. Exam Location:  Inpatient Procedure: Limited Echo, Cardiac Doppler and Color Doppler Indications:    Pericardial  effusion  History:        Patient has prior history of Echocardiogram examinations, most                 recent 06/07/2018. COPD, Arrythmias:Atrial Fibrillation; Risk                 Factors:Hypertension. CKD.  Sonographer:    Ross Ludwig RDCS (AE) Referring Phys: [id number] [facility]  Sonographer Comments: Image acquisition challenging due to  COPD. IMPRESSIONS  1. An urgent pericardiocentesis or pericardial window is indicated.  2. Large circumferential pericardial effusion with maximum diameter 3 cm posteriorly. There are signs of tamponade with > 20 % respiratory variability on mitral inflow, poor RV filling, dilated IVC.  3. LVEF can't be assessed as the heart rate is 160 BPM.  4. Large pericardial effusion. The pericardial effusion is circumferential. Findings are consistent with cardiac tamponade. FINDINGS  Pericardium: A large pericardial effusion is present. The pericardial effusion is circumferential. There is excessive respiratory variation in the mitral valve spectral Doppler velocities and excessive respiratory variation in septal movement. There is evidence of cardiac tamponade.  IVC IVC diam: 2.40 cm Tobias Alexander MD Electronically signed by Tobias Alexander MD Signature Date/Time: 12/06/2019/8:48:13 PM    Final    Disposition   Pt is being discharged home today in good condition.  Follow-up Plans & Appointments    Follow-up Information    Lightfoot, Eliezer Lofts, MD Follow up.   Specialty: Cardiothoracic Surgery Why: Please see discharge paperwork for follow-up appointment with surgeon.  Also obtain a chest x-ray at [facility] 1/2-hour prior to appointment. It is  is in the same office complex on the first floor. Contact information: [address] [place] [address] [phone]        Thomasene Ripple, DO Follow up on 12/19/2019.   Specialty: Cardiology Why: 9:20AM. Cardiology visit Contact information: [address] [place] [address] [phone]             Discharge Medications   Allergies as of 12/16/2019   No Known Allergies     Medication List    STOP taking these medications   amLODipine 5 MG tablet Commonly known as: NORVASC   telmisartan 40 MG tablet Commonly known as: MICARDIS     TAKE these medications   acetaminophen 500 MG tablet Commonly known as: TYLENOL Take 2 tablets (1,000 mg total) by mouth every 6 (six) hours.   apixaban 2.5 MG Tabs tablet Commonly known as: ELIQUIS Take 1 tablet (2.5 mg total) by mouth 2 (two) times daily.   aspirin EC 81 MG tablet Take 81 mg by mouth daily.   diltiazem 120 MG 24 hr capsule Commonly known as: CARDIZEM CD Take 1 capsule (120 mg total) by mouth daily. Start taking on: Dec 17, 2019   folic acid 1 MG tablet Commonly known as: FOLVITE Take 1 mg by mouth daily.   furosemide 20 MG tablet Commonly known as: LASIX Take 1 tablet (20 mg total) by mouth every other day. Start taking on: Dec 17, 2019   GENTEAL TEARS OP Place 1 drop into both eyes daily as needed (dry eyes).   levothyroxine 112 MCG tablet Commonly known as: SYNTHROID Take 112 mcg by mouth daily.   multivitamin with minerals Tabs tablet Take 1 tablet by mouth daily.   OXYGEN Inhale 2 L into the lungs at bedtime.   pantoprazole 40 MG tablet Commonly known as: PROTONIX Take 40 mg by mouth daily.   tamsulosin 0.4 MG Caps capsule Commonly known as: FLOMAX Take 1 capsule (0.4 mg total) by mouth daily.          Outstanding Labs/Studies   BMET on followup  Duration of Discharge Encounter   Greater than 30 minutes including physician time.  Ramond Dial, PA 12/16/2019, 4:41 PM  ATTENDING ATTESTATION  I have seen, examined and evaluated the patient this morning on rounds along with Azalee Course, PA.  After reviewing all the available data and chart, we discussed the patients laboratory, study & physical findings as well as symptoms in detail. I agree with his findings, examination,  discharge summary as well as impression recommendations as per our discussion.    Mr. Merriott is doing outstanding today.  He says he he felt better last night than he has in about a month.  His breathing is pretty much back to baseline.  He did well ambulating in the hallway.  Physical therapy did not feel like he needed any assistance.  The plan is to discharge him home on current meds with scheduled every other day Lasix to avoid volume overload.  We have also placed him on diltiazem which has been difficult because of bradycardia, but he has significant PVCs and is otherwise been relatively stable.  He will need an outpatient echocardiogram to follow-up pericardial effusion.  This will be arranged.  He was otherwise stable for discharge    Bryan Lemma, M.D., M.S. Interventional Cardiologist   Pager # [phone] Phone # [phone] [address] [place], [place] [address]

## 2019-12-16 NOTE — Care Management Important Message (Signed)
Important Message  Patient Details  Name: Keith Nelson MRN: [id number] Date of Birth: 04-03-1930   Medicare Important Message Given:  Yes     Renie Ora 12/16/2019, 9:25 AM

## 2019-12-16 NOTE — Progress Notes (Signed)
SATURATION QUALIFICATIONS: (This note is used to comply with regulatory documentation for home oxygen)  Patient Saturations on Room Air at Rest = 97%  Patient Saturations on Room Air while Ambulating = 89%  Patient Saturations on NA Liters of oxygen while Ambulating = NA  Please briefly explain why patient needs home oxygen: Does not qualify.

## 2019-12-18 ENCOUNTER — Ambulatory Visit
Admission: RE | Admit: 2019-12-18 | Discharge: 2019-12-18 | Disposition: A | Discharge status: Home / Self Care | Payer: Medicare HMO | Source: Ambulatory Visit | Attending: Thoracic Surgery (Cardiothoracic Vascular Surgery) | Admitting: Thoracic Surgery (Cardiothoracic Vascular Surgery)

## 2019-12-18 ENCOUNTER — Other Ambulatory Visit: Payer: Self-pay | Admitting: Thoracic Surgery (Cardiothoracic Vascular Surgery)

## 2019-12-18 ENCOUNTER — Other Ambulatory Visit: Payer: Self-pay

## 2019-12-18 ENCOUNTER — Ambulatory Visit: Payer: Medicare HMO | Admitting: Thoracic Surgery (Cardiothoracic Vascular Surgery)

## 2019-12-18 ENCOUNTER — Ambulatory Visit (INDEPENDENT_AMBULATORY_CARE_PROVIDER_SITE_OTHER): Payer: Self-pay | Admitting: Thoracic Surgery (Cardiothoracic Vascular Surgery)

## 2019-12-18 VITALS — BP 160/69 | HR 69 | Temp 97.6°F | Resp 20 | Ht 68.0 in | Wt 188.0 lb

## 2019-12-18 DIAGNOSIS — I3139 Other pericardial effusion (noninflammatory): Secondary | ICD-10-CM

## 2019-12-18 DIAGNOSIS — I313 Pericardial effusion (noninflammatory): Secondary | ICD-10-CM

## 2019-12-18 DIAGNOSIS — Z09 Encounter for follow-up examination after completed treatment for conditions other than malignant neoplasm: Secondary | ICD-10-CM

## 2019-12-18 DIAGNOSIS — I314 Cardiac tamponade: Secondary | ICD-10-CM

## 2019-12-18 NOTE — Progress Notes (Signed)
      [address]       [place] [address]             [phone]        Keith Nelson [facility] Medical Record #[id number] Date of Birth: July 08, 1930  Referring: Thomasene Ripple, DO Primary Care: [name], NP Primary Cardiologist:Kardie Tobb, DO  Reason for visit:   follow-up  History of Present Illness:     Mr. Keith Nelson comes in for his first follow-up appointment.  He states that his breathing is much improved.  He has no complaints  Physical Exam: BP (!) 160/69   Pulse 69   Temp 97.6 F (36.4 C) (Skin)   Resp 20   Ht 5\' 8"  (1.727 m)   Wt 188 lb (85.3 kg)   SpO2 90% Comment: RA  BMI 28.59 kg/m   Alert NAD Incision clean.  Abdomen soft, ND No peripheral edema   Diagnostic Studies & Laboratory data: CXR: Trace effusion.  Cardiac silhouette is much smaller Path: Negative for malignancy    Assessment / Plan:   86 year old male status post right robotic assisted thoracoscopy and pericardial window. Overall doing well Follow-up as needed   97 12/18/2019 1:18 PM

## 2019-12-19 ENCOUNTER — Ambulatory Visit (INDEPENDENT_AMBULATORY_CARE_PROVIDER_SITE_OTHER): Payer: Medicare HMO | Admitting: Cardiology

## 2019-12-19 ENCOUNTER — Encounter: Payer: Self-pay | Admitting: Cardiology

## 2019-12-19 VITALS — BP 140/78 | HR 63 | Ht 68.0 in | Wt 188.0 lb

## 2019-12-19 DIAGNOSIS — Z87891 Personal history of nicotine dependence: Secondary | ICD-10-CM

## 2019-12-19 DIAGNOSIS — I10 Essential (primary) hypertension: Secondary | ICD-10-CM

## 2019-12-19 DIAGNOSIS — I480 Paroxysmal atrial fibrillation: Secondary | ICD-10-CM

## 2019-12-19 DIAGNOSIS — J9621 Acute and chronic respiratory failure with hypoxia: Secondary | ICD-10-CM | POA: Diagnosis not present

## 2019-12-19 DIAGNOSIS — I313 Pericardial effusion (noninflammatory): Secondary | ICD-10-CM

## 2019-12-19 DIAGNOSIS — N183 Chronic kidney disease, stage 3 unspecified: Secondary | ICD-10-CM

## 2019-12-19 DIAGNOSIS — E785 Hyperlipidemia, unspecified: Secondary | ICD-10-CM

## 2019-12-19 DIAGNOSIS — I3139 Other pericardial effusion (noninflammatory): Secondary | ICD-10-CM
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
## 2019-12-19 NOTE — Patient Instructions (Signed)
Medication Instructions:  Your physician recommends that you continue on your current medications as directed. Please refer to the Current Medication list given to you today.  *If you need a refill on your cardiac medications before your next appointment, please call your pharmacy*   Lab Work: None If you have labs (blood work) drawn today and your tests are completely normal, you will receive your results only by: . MyChart Message (if you have MyChart) OR . A paper copy in the mail If you have any lab test that is abnormal or we need to change your treatment, we will call you to review the results.   Testing/Procedures: Your physician has requested that you have an echocardiogram. Echocardiography is a painless test that uses sound waves to create images of your heart. It provides your doctor with information about the size and shape of your heart and how well your heart's chambers and valves are working. This procedure takes approximately one hour. There are no restrictions for this procedure.     Follow-Up: At [facility], you and your health needs are our priority.  As part of our continuing mission to provide you with exceptional heart care, we have created designated Provider Care Teams.  These Care Teams include your primary Cardiologist (physician) and Advanced Practice Providers (APPs -  Physician Assistants and Nurse Practitioners) who all work together to provide you with the care you need, when you need it.  We recommend signing up for the patient portal called "MyChart".  Sign up information is provided on this After Visit Summary.  MyChart is used to connect with patients for Virtual Visits (Telemedicine).  Patients are able to view lab/test results, encounter notes, upcoming appointments, etc.  Non-urgent messages can be sent to your provider as well.   To learn more about what you can do with MyChart, go to [email or web address].    Your next appointment:   3  month(s)  The format for your next appointment:   In Person  Provider:   Kardie Tobb, DO   Other Instructions   

## 2020-01-01 ENCOUNTER — Ambulatory Visit (INDEPENDENT_AMBULATORY_CARE_PROVIDER_SITE_OTHER): Payer: Medicare HMO

## 2020-01-01 ENCOUNTER — Other Ambulatory Visit: Payer: Self-pay

## 2020-01-01 DIAGNOSIS — I313 Pericardial effusion (noninflammatory): Secondary | ICD-10-CM

## 2020-01-01 DIAGNOSIS — I3139 Other pericardial effusion (noninflammatory): Secondary | ICD-10-CM

## 2020-01-01 NOTE — Progress Notes (Signed)
2D echo has been performed. °

## 2020-01-02 MED ORDER — METOPROLOL SUCCINATE ER 25 MG PO TB24
25.0000 mg | ORAL_TABLET | Freq: Every day | ORAL | 6 refills | Status: DC
Start: 2020-01-02 — End: 2020-01-19

## 2020-01-19 ENCOUNTER — Other Ambulatory Visit: Payer: Self-pay

## 2020-01-19 ENCOUNTER — Ambulatory Visit (INDEPENDENT_AMBULATORY_CARE_PROVIDER_SITE_OTHER): Payer: Medicare HMO | Admitting: Cardiology

## 2020-01-19 ENCOUNTER — Encounter: Payer: Self-pay | Admitting: Cardiology

## 2020-01-19 VITALS — BP 171/78 | HR 53 | Ht 68.0 in | Wt 185.0 lb

## 2020-01-19 DIAGNOSIS — I480 Paroxysmal atrial fibrillation: Secondary | ICD-10-CM

## 2020-01-19 DIAGNOSIS — I10 Essential (primary) hypertension: Secondary | ICD-10-CM | POA: Diagnosis not present

## 2020-01-19 DIAGNOSIS — I313 Pericardial effusion (noninflammatory): Secondary | ICD-10-CM | POA: Diagnosis not present

## 2020-01-19 DIAGNOSIS — I314 Cardiac tamponade: Secondary | ICD-10-CM | POA: Diagnosis not present

## 2020-01-19 DIAGNOSIS — I3139 Other pericardial effusion (noninflammatory): Secondary | ICD-10-CM

## 2020-01-19 LAB — BASIC METABOLIC PANEL
BUN/Creatinine Ratio: 12 (ref 10–24)
BUN: 18 mg/dL (ref 8–27)
CO2: 21 mmol/L (ref 20–29)
Calcium: 8.9 mg/dL (ref 8.6–10.2)
Chloride: 101 mmol/L (ref 96–106)
Creatinine, Ser: 1.49 mg/dL — ABNORMAL HIGH (ref 0.76–1.27)
GFR calc Af Amer: 48 mL/min/{1.73_m2} — ABNORMAL LOW (ref >59)
GFR calc non Af Amer: 42 mL/min/{1.73_m2} — ABNORMAL LOW (ref >59)
Glucose: 96 mg/dL (ref 65–99)
Potassium: 4.6 mmol/L (ref 3.5–5.2)
Sodium: 136 mmol/L (ref 134–144)

## 2020-01-19 LAB — MAGNESIUM: Magnesium: 2.4 mg/dL — ABNORMAL HIGH (ref 1.6–2.3)

## 2020-01-19 MED ORDER — APIXABAN 2.5 MG PO TABS: 2.5000 mg | ORAL_TABLET | Freq: Two times a day (BID) | ORAL | 5 refills | Status: DC

## 2020-01-19 MED ORDER — CARVEDILOL 6.25 MG PO TABS
6.2500 mg | ORAL_TABLET | Freq: Two times a day (BID) | ORAL | 3 refills | Status: DC
Start: 2020-01-19 — End: 2020-12-20
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
## 2020-01-19 NOTE — Patient Instructions (Signed)
Medication Instructions:  Your physician has recommended you make the following change in your medication:  STOP: Toprol XL START: Coreg 6.25 mg take one tablet by mouth twice daily.  *If you need a refill on your cardiac medications before your next appointment, please call your pharmacy*   Lab Work: Your physician recommends that you return for lab work in: TODAY BMP, Mag If you have labs (blood work) drawn today and your tests are completely normal, you will receive your results only by: Marland Kitchen MyChart Message (if you have MyChart) OR . A paper copy in the mail If you have any lab test that is abnormal or we need to change your treatment, we will call you to review the results.   Testing/Procedures: None   Follow-Up: At [facility], you and your health needs are our priority.  As part of our continuing mission to provide you with exceptional heart care, we have created designated Provider Care Teams.  These Care Teams include your primary Cardiologist (physician) and Advanced Practice Providers (APPs -  Physician Assistants and Nurse Practitioners) who all work together to provide you with the care you need, when you need it.  We recommend signing up for the patient portal called "MyChart".  Sign up information is provided on this After Visit Summary.  MyChart is used to connect with patients for Virtual Visits (Telemedicine).  Patients are able to view lab/test results, encounter notes, upcoming appointments, etc.  Non-urgent messages can be sent to your provider as well.   To learn more about what you can do with MyChart, go to [email or web address].    Your next appointment:   3 month(s)  The format for your next appointment:   In Person  Provider:   Thomasene Ripple, DO   Other Instructions

## 2020-01-20 ENCOUNTER — Telehealth: Payer: Self-pay

## 2020-01-20 NOTE — Telephone Encounter (Signed)
-----   Message from Thomasene Ripple, DO sent at 01/19/2020 10:20 PM EDT ----- Cr improved from one month ago.

## 2020-01-20 NOTE — Telephone Encounter (Signed)
Left message on patients voicemail to please return our call.   

## 2020-01-21 ENCOUNTER — Telehealth: Payer: Self-pay | Admitting: Cardiology

## 2020-01-21 NOTE — Telephone Encounter (Signed)
    Pt's brother calling, would like to speak with Nurse Lequita Halt. Transferred call

## 2020-01-21 NOTE — Telephone Encounter (Signed)
Spoke with patient regarding results and recommendation.  Patient verbalizes understanding and is agreeable to plan of care. Advised patient to call back with any issues or concerns.  

## 2020-03-06 ENCOUNTER — Other Ambulatory Visit: Payer: Self-pay | Admitting: Physician Assistant

## 2020-03-31 ENCOUNTER — Ambulatory Visit: Payer: Medicare HMO | Admitting: Cardiology

## 2020-04-26 ENCOUNTER — Ambulatory Visit: Payer: Medicare HMO | Admitting: Cardiology

## 2020-04-26 ENCOUNTER — Telehealth: Payer: Self-pay | Admitting: Cardiology

## 2020-04-26 NOTE — Telephone Encounter (Signed)
Pts bother Keith Nelson advised and will call EMS to help transport him since he has no travel O2 tanks at home.

## 2020-04-26 NOTE — Telephone Encounter (Signed)
Spoke with Keith Nelson pts brother on [place] [phone]... h is with the pt... pt has not been using his O2 much his O2 sats have been staying in the low 90's.... since yesterday he has reported his O2 sats dropping down to the mid 80's.   Pt says he feels well now that he has the O2 on all day at 2 liters... but he is coughing a lot and bringing up clear sputum. He has h/o pericardial effusion in 12/2019.   He spoke with a nurse at [facility] and she says she is worried he may have fluid building back u and to call his Cardiologist.   He does not appear uncomfortable to him and he denies any peripheral edema. No fever, no chest pain, no pain with breathing. He does not eat much but has been hydrating well with water.   I will forward to Dr. Servando Salina for advice... I advised him that if anything changes worsens to call EMS asap.

## 2020-04-26 NOTE — Telephone Encounter (Signed)
He also need to be seen by his pcp, while he had recurrent pericardial effusion. His symptoms is suggestion respiratory process ( hypoxia, cough and sputum), He may have also have copd exacerbation. He needs to go to the ED. Where he can also be evaluated and also tested for COvid.

## 2020-04-27 DIAGNOSIS — I10 Essential (primary) hypertension: Secondary | ICD-10-CM | POA: Insufficient documentation

## 2020-04-27 DIAGNOSIS — N182 Chronic kidney disease, stage 2 (mild): Secondary | ICD-10-CM | POA: Insufficient documentation

## 2020-04-27 DIAGNOSIS — E7800 Pure hypercholesterolemia, unspecified: Secondary | ICD-10-CM | POA: Insufficient documentation

## 2020-04-27 DIAGNOSIS — D649 Anemia, unspecified: Secondary | ICD-10-CM | POA: Insufficient documentation

## 2020-04-27 DIAGNOSIS — J189 Pneumonia, unspecified organism: Secondary | ICD-10-CM | POA: Insufficient documentation

## 2020-04-27 DIAGNOSIS — M1990 Unspecified osteoarthritis, unspecified site: Secondary | ICD-10-CM | POA: Insufficient documentation

## 2020-04-28 ENCOUNTER — Other Ambulatory Visit: Payer: Self-pay

## 2020-04-28 ENCOUNTER — Ambulatory Visit (INDEPENDENT_AMBULATORY_CARE_PROVIDER_SITE_OTHER): Payer: Medicare HMO | Admitting: Cardiology

## 2020-04-28 ENCOUNTER — Encounter: Payer: Self-pay | Admitting: Cardiology

## 2020-04-28 VITALS — BP 160/80 | HR 60 | Ht 67.0 in | Wt 184.8 lb

## 2020-04-28 DIAGNOSIS — R0602 Shortness of breath: Secondary | ICD-10-CM | POA: Diagnosis not present

## 2020-04-28 DIAGNOSIS — E785 Hyperlipidemia, unspecified: Secondary | ICD-10-CM

## 2020-04-28 DIAGNOSIS — I3139 Other pericardial effusion (noninflammatory): Secondary | ICD-10-CM

## 2020-04-28 DIAGNOSIS — I10 Essential (primary) hypertension: Secondary | ICD-10-CM

## 2020-04-28 DIAGNOSIS — I480 Paroxysmal atrial fibrillation: Secondary | ICD-10-CM

## 2020-04-28 DIAGNOSIS — I313 Pericardial effusion (noninflammatory): Secondary | ICD-10-CM

## 2020-04-28 MED ORDER — POTASSIUM CHLORIDE CRYS ER 20 MEQ PO TBCR
20.0000 meq | EXTENDED_RELEASE_TABLET | Freq: Every day | ORAL | 3 refills | Status: DC
Start: 2020-04-28 — End: 2021-03-11

## 2020-04-28 MED ORDER — FUROSEMIDE 40 MG PO TABS
40.0000 mg | ORAL_TABLET | Freq: Every day | ORAL | 3 refills | Status: DC
Start: 2020-04-28 — End: 2021-03-11

## 2020-04-28 NOTE — Patient Instructions (Addendum)
Medication Instructions:  Your physician has recommended you make the following change in your medication:  1.  INCREASE Lasix to 40 mg taking 1 daily = you can take 2 of the 20 mg tablets at the same time 2.  START Potassium 20 meq taking 1 daily   *If you need a refill on your cardiac medications before your next appointment, please call your pharmacy*   Lab Work: None ordered  If you have labs (blood work) drawn today and your tests are completely normal, you will receive your results only by: Marland Kitchen MyChart Message (if you have MyChart) OR . A paper copy in the mail If you have any lab test that is abnormal or we need to change your treatment, we will call you to review the results.   Testing/Procedures: None ordered   Follow-Up: At [facility], you and your health needs are our priority.  As part of our continuing mission to provide you with exceptional heart care, we have created designated Provider Care Teams.  These Care Teams include your primary Cardiologist (physician) and Advanced Practice Providers (APPs -  Physician Assistants and Nurse Practitioners) who all work together to provide you with the care you need, when you need it.  We recommend signing up for the patient portal called "MyChart".  Sign up information is provided on this After Visit Summary.  MyChart is used to connect with patients for Virtual Visits (Telemedicine).  Patients are able to view lab/test results, encounter notes, upcoming appointments, etc.  Non-urgent messages can be sent to your provider as well.   To learn more about what you can do with MyChart, go to [email or web address].    Your next appointment:   2 weeks  The format for your next appointment:   In Person  Provider:   Thomasene Ripple, DO   Other Instructions
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
## 2020-05-14 ENCOUNTER — Encounter: Payer: Self-pay | Admitting: Cardiology

## 2020-05-14 ENCOUNTER — Ambulatory Visit (INDEPENDENT_AMBULATORY_CARE_PROVIDER_SITE_OTHER): Payer: Medicare HMO | Admitting: Cardiology

## 2020-05-14 ENCOUNTER — Other Ambulatory Visit: Payer: Self-pay

## 2020-05-14 VITALS — BP 128/60 | HR 58 | Ht 68.0 in | Wt 187.2 lb

## 2020-05-14 DIAGNOSIS — I5020 Unspecified systolic (congestive) heart failure: Secondary | ICD-10-CM

## 2020-05-14 DIAGNOSIS — I10 Essential (primary) hypertension: Secondary | ICD-10-CM | POA: Diagnosis not present

## 2020-05-14 DIAGNOSIS — E785 Hyperlipidemia, unspecified: Secondary | ICD-10-CM

## 2020-05-14 DIAGNOSIS — I420 Dilated cardiomyopathy: Secondary | ICD-10-CM

## 2020-05-14 DIAGNOSIS — I480 Paroxysmal atrial fibrillation: Secondary | ICD-10-CM | POA: Diagnosis not present

## 2020-05-14 HISTORY — DX: Unspecified systolic (congestive) heart failure: I50.20

## 2020-05-14 HISTORY — DX: Dilated cardiomyopathy: I42.0
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
## 2020-05-14 NOTE — Patient Instructions (Signed)
Medication Instructions:  Your physician recommends that you continue on your current medications as directed. Please refer to the Current Medication list given to you today.  *If you need a refill on your cardiac medications before your next appointment, please call your pharmacy*   Lab Work: Your physician recommends that you return for lab work; BMET and Magnesium.   If you have labs (blood work) drawn today and your tests are completely normal, you will receive your results only by:  MyChart Message (if you have MyChart) OR  A paper copy in the mail If you have any lab test that is abnormal or we need to change your treatment, we will call you to review the results.   Testing/Procedures: None   Follow-Up: At [facility], you and your health needs are our priority.  As part of our continuing mission to provide you with exceptional heart care, we have created designated Provider Care Teams.  These Care Teams include your primary Cardiologist (physician) and Advanced Practice Providers (APPs -  Physician Assistants and Nurse Practitioners) who all work together to provide you with the care you need, when you need it.  We recommend signing up for the patient portal called "MyChart".  Sign up information is provided on this After Visit Summary.  MyChart is used to connect with patients for Virtual Visits (Telemedicine).  Patients are able to view lab/test results, encounter notes, upcoming appointments, etc.  Non-urgent messages can be sent to your provider as well.   To learn more about what you can do with MyChart, go to [email or web address].    Your next appointment:   3 month(s)  The format for your next appointment:   In Person  Provider:   Thomasene Ripple, DO   Other Instructions

## 2020-05-15 LAB — BASIC METABOLIC PANEL
BUN/Creatinine Ratio: 13 (ref 10–24)
BUN: 21 mg/dL (ref 8–27)
CO2: 24 mmol/L (ref 20–29)
Calcium: 9.1 mg/dL (ref 8.6–10.2)
Chloride: 101 mmol/L (ref 96–106)
Creatinine, Ser: 1.62 mg/dL — ABNORMAL HIGH (ref 0.76–1.27)
GFR calc Af Amer: 43 mL/min/{1.73_m2} — ABNORMAL LOW (ref >59)
GFR calc non Af Amer: 38 mL/min/{1.73_m2} — ABNORMAL LOW (ref >59)
Glucose: 153 mg/dL — ABNORMAL HIGH (ref 65–99)
Potassium: 4.1 mmol/L (ref 3.5–5.2)
Sodium: 138 mmol/L (ref 134–144)

## 2020-05-15 LAB — MAGNESIUM: Magnesium: 2.2 mg/dL (ref 1.6–2.3)

## 2020-06-08 IMAGING — CT CT CHEST W/O CM
2 of 3 series · 15 of 36 positions shown, 18 images · non-contrast
Comparison: 12/06/2019

CLINICAL DATA: Evaluate pleural effusion.  Malignancy suspected.

EXAM:
CT CHEST WITHOUT CONTRAST
TECHNIQUE: Multidetector CT imaging of the chest was performed following the
standard protocol without IV contrast.

[Series 3: chest w/o 2mm st · axial · non-contrast · 0.84mm/px · z∈[-280,-26]mm · 12 of 149 slices shown, 15 images]
[im 11/149  mediastinal]
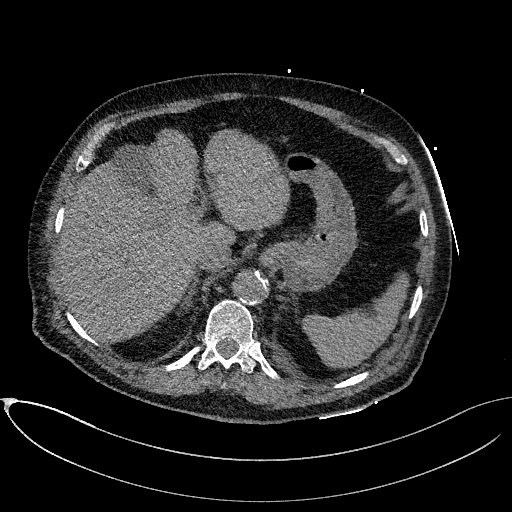
[im 11/149  lung]
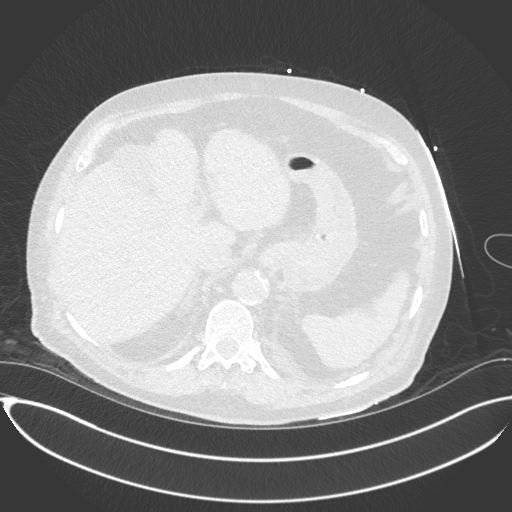
[im 22/149  lung]
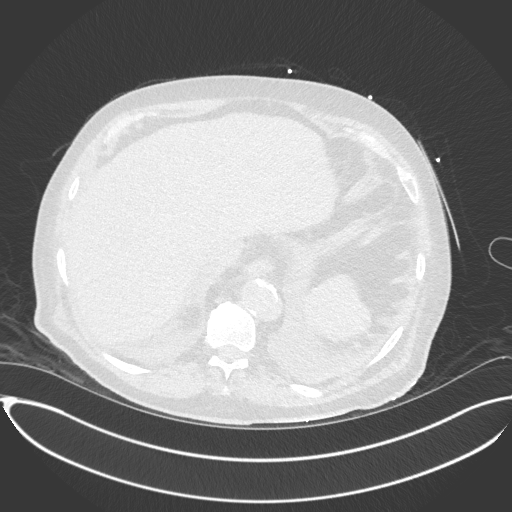
[im 33/149  lung]
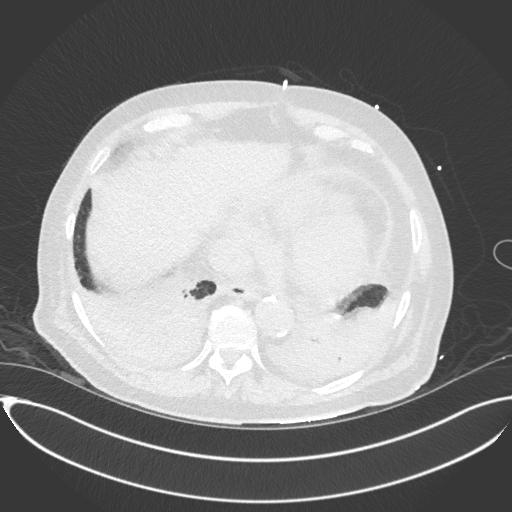
[im 44/149  lung]
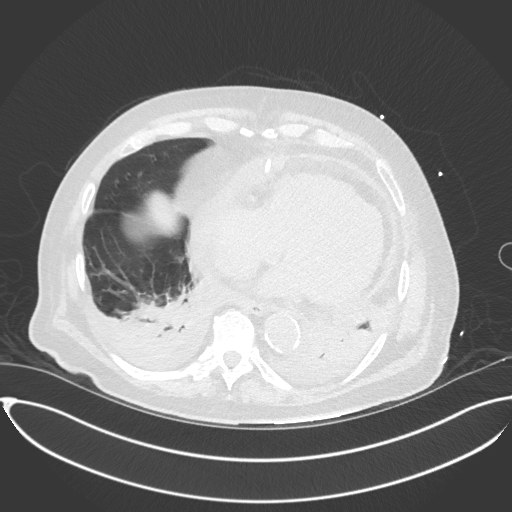
[im 55/149  mediastinal]
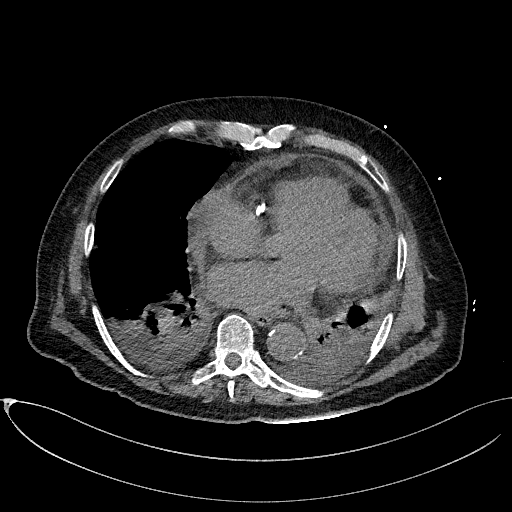
[im 55/149  lung]
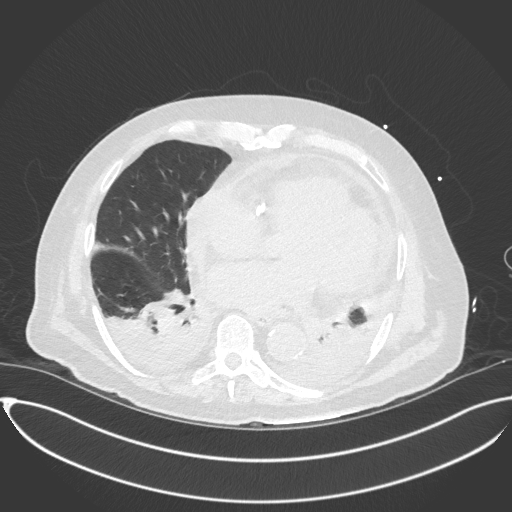
[im 66/149  lung]
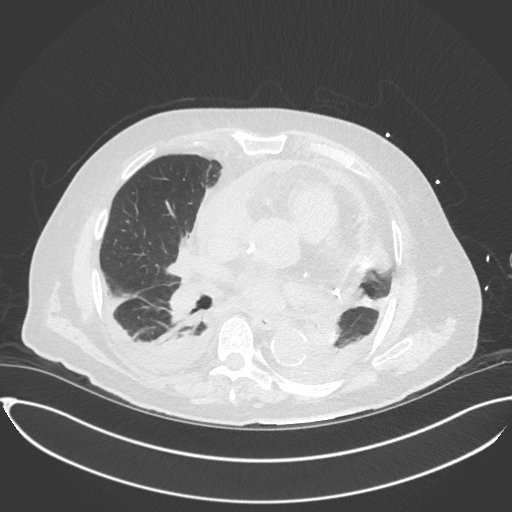
[im 83/149  lung]
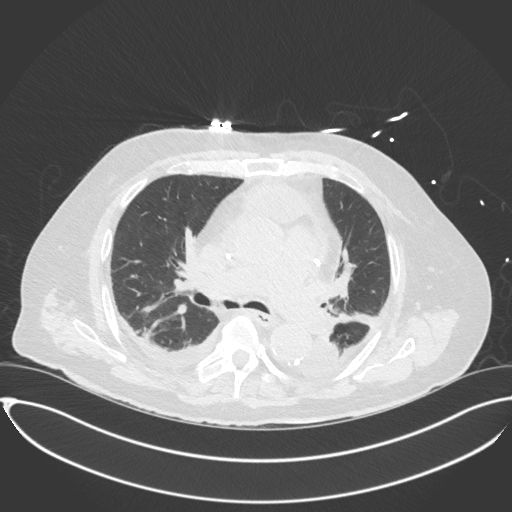
[im 94/149  lung]
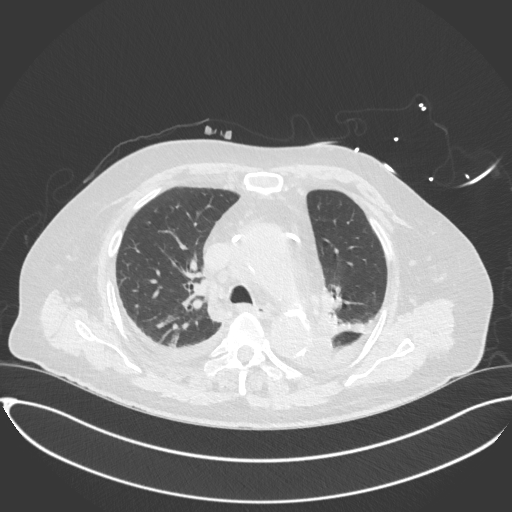
[im 105/149  mediastinal]
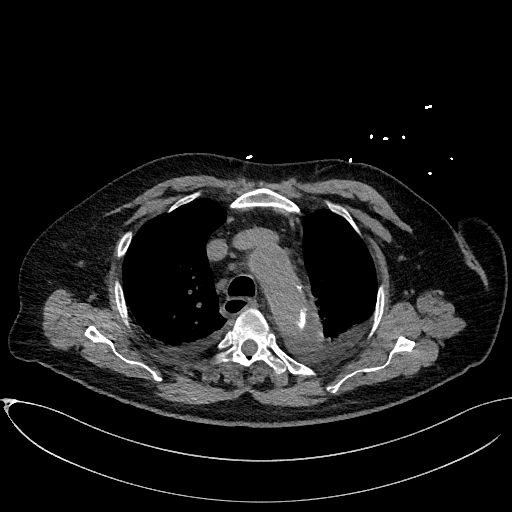
[im 105/149  lung]
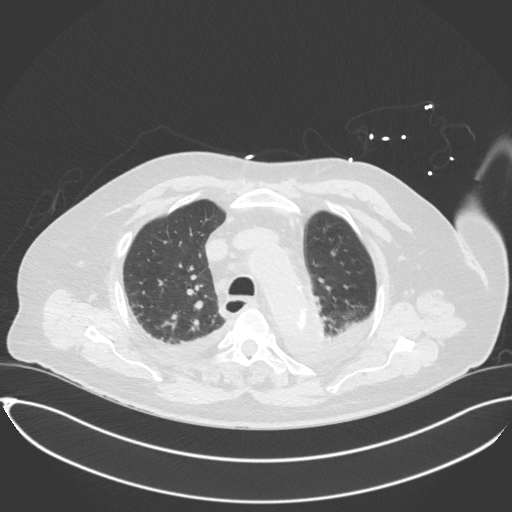
[im 116/149  lung]
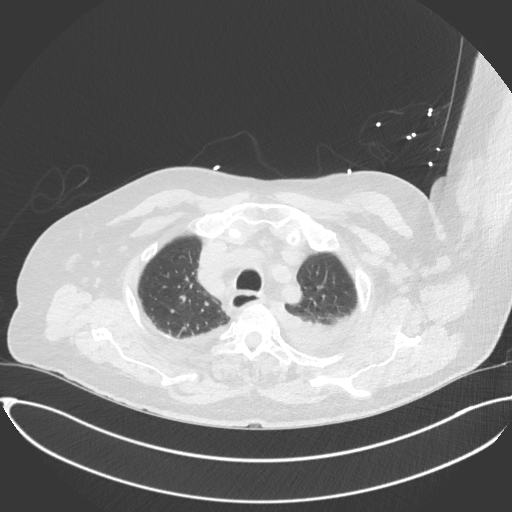
[im 127/149  lung]
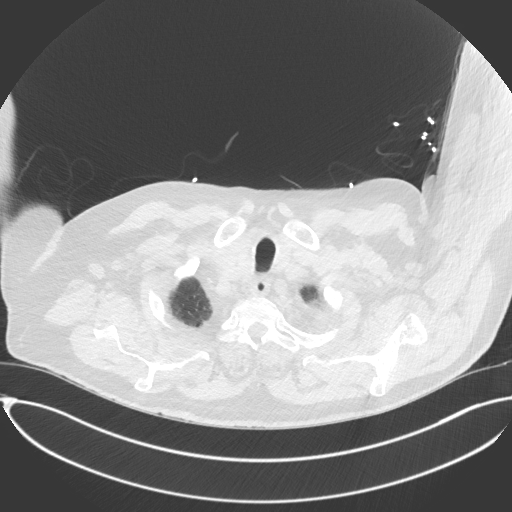
[im 138/149  lung]
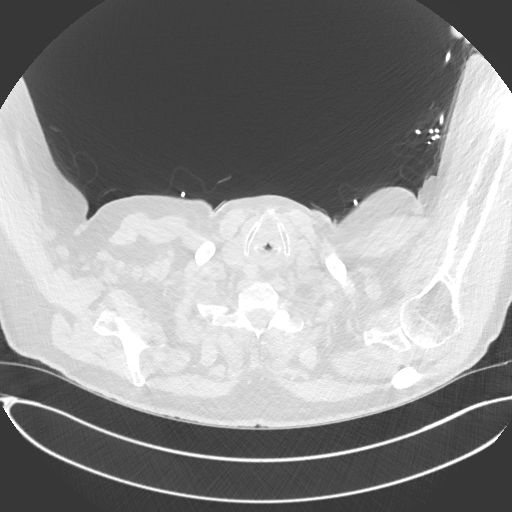

[Series 5: chest w/o 2mm st cor · coronal · non-contrast · 0.56mm/px · 3 of 151 slices shown]
[im 31/151  lung]
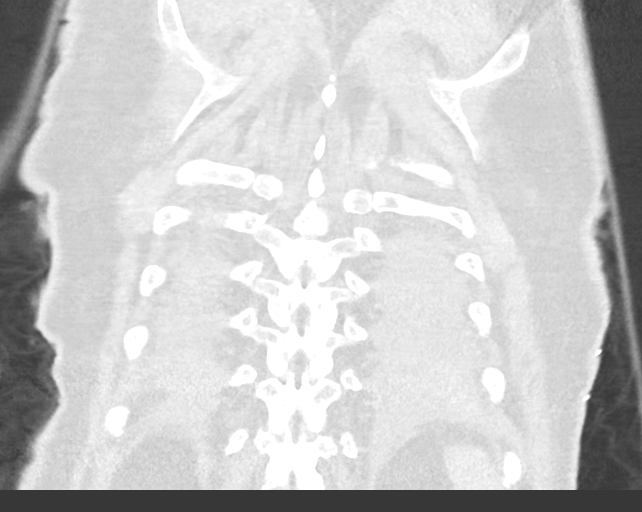
[im 61/151  lung]
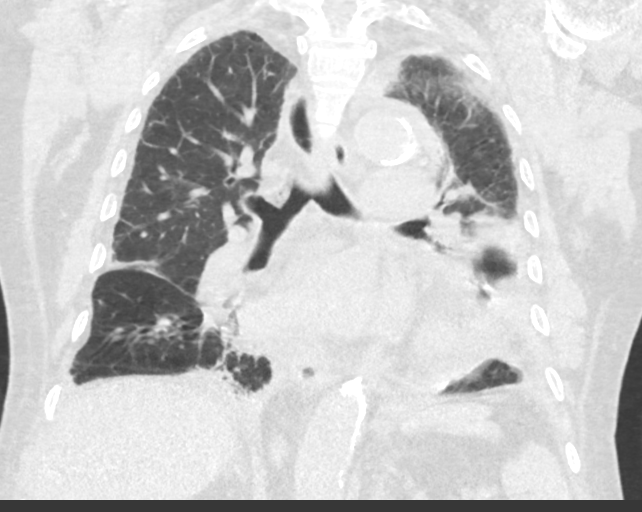
[im 91/151  lung]
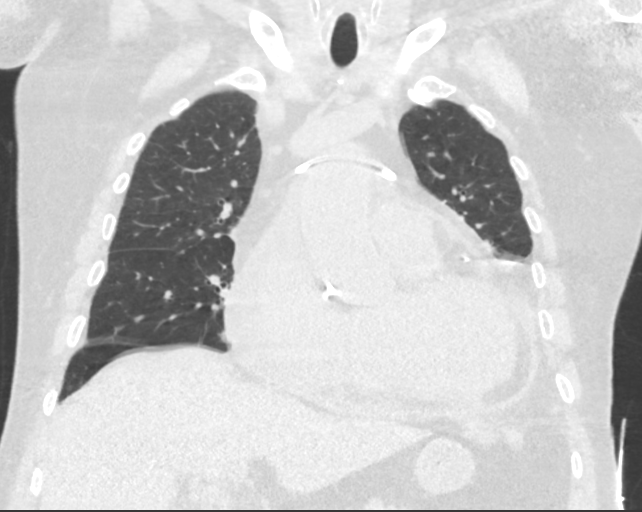

[15 of 36 positions shown; findings below may reference images not displayed]

FINDINGS: Cardiovascular: Borderline cardiomegaly. Moderate interval
improvement patient's pericardial effusion with small residual
pericardial effusion. A pericardial drain is in place with tip over
the left posterior pericardium. Calcified plaque over the left main
and 3 vessel coronary arteries. Calcified plaque over the thoracic
aorta which is normal in caliber. Remaining vascular structures are
unremarkable.

Mediastinum/Nodes: No significant mediastinal or hilar adenopathy.
Remaining mediastinal structures are unremarkable.

Lungs/Pleura: Lungs are adequately inflated demonstrate interval
worsening of small bilateral pleural effusions with associated
basilar atelectasis. Airways are unremarkable.

Upper Abdomen: Calcified plaque over the abdominal aorta. Minimal
nodular contour of the left lobe of the liver. Remainder the upper
abdomen is unchanged.

Musculoskeletal: Unchanged.
IMPRESSION: 1. Moderate interval improvement patient's pericardial effusion post
placement of pericardial drain with small residual pericardial
effusion.

2. Worsening bilateral small pleural effusions with associated
dependent atelectasis.

3. Aortic Atherosclerosis ([id number]). Atherosclerotic coronary
artery disease.

## 2020-06-09 IMAGING — DX DG CHEST 1V PORT
1 series · 1 of 1 positions shown · non-contrast
Comparison: CT chest 12/07/2019

CLINICAL DATA: Pericardial effusion

EXAM:
PORTABLE CHEST 1 VIEW

[chest ap]
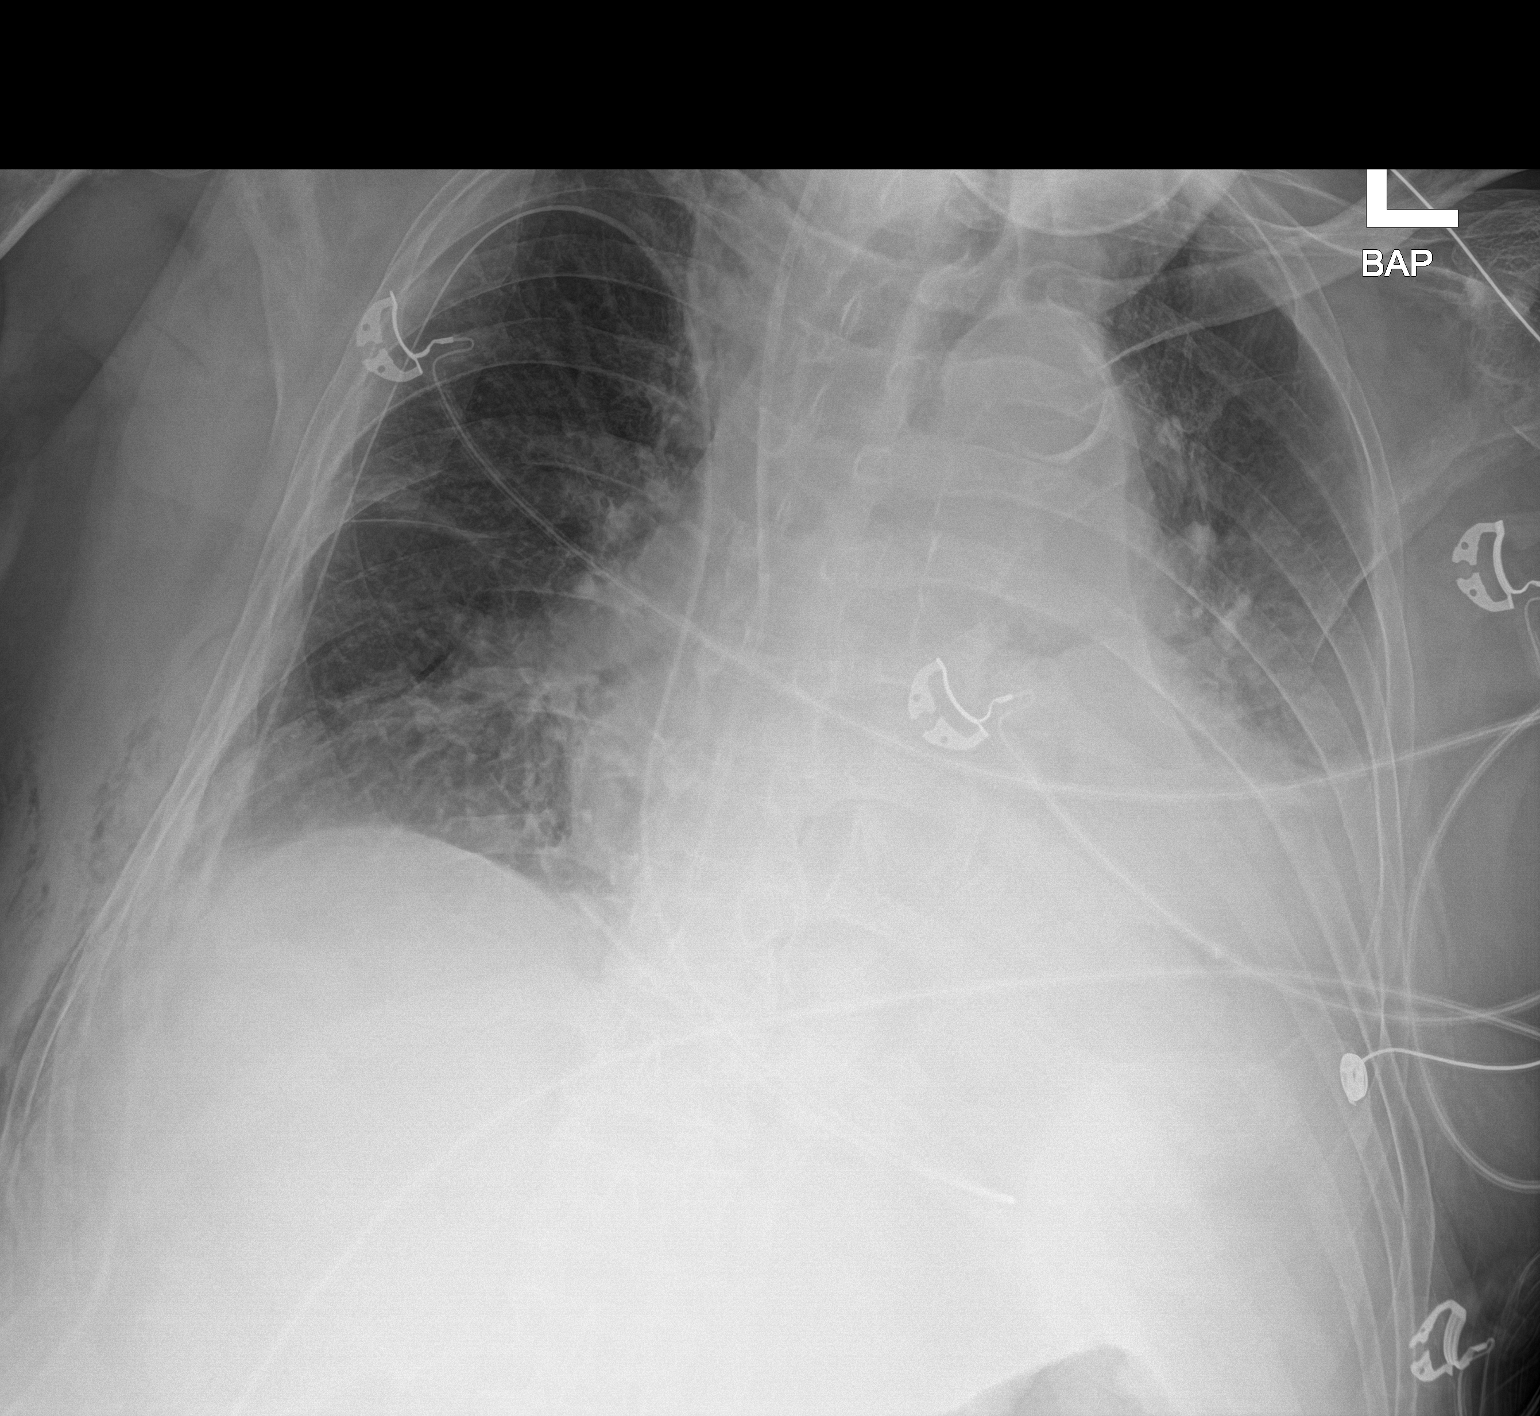

[1 of 1 positions shown; findings below may reference images not displayed]

FINDINGS: Right jugular central venous catheter with the tip projecting over
the SVC. Pericardial drain is again noted. Small bilateral pleural
effusions, left greater than right. Bilateral mild interstitial
thickening. No pneumothorax. Stable cardiomegaly. Thoracic aortic
atherosclerosis. No acute osseous abnormality.
IMPRESSION: 1. Right jugular central venous catheter with the tip projecting
over the SVC. Pericardial drain is again noted.
2. Small bilateral pleural effusions, left greater than right.
3. Stable cardiomegaly with pulmonary vascular congestion.

## 2020-06-11 IMAGING — DX DG CHEST 1V PORT
1 series · 1 of 1 positions shown · non-contrast
Comparison: 12/09/2019 and CT chest 12/07/2019.

CLINICAL DATA: Shortness of breath, pericardial effusion.

EXAM:
PORTABLE CHEST 1 VIEW

[chest ap]
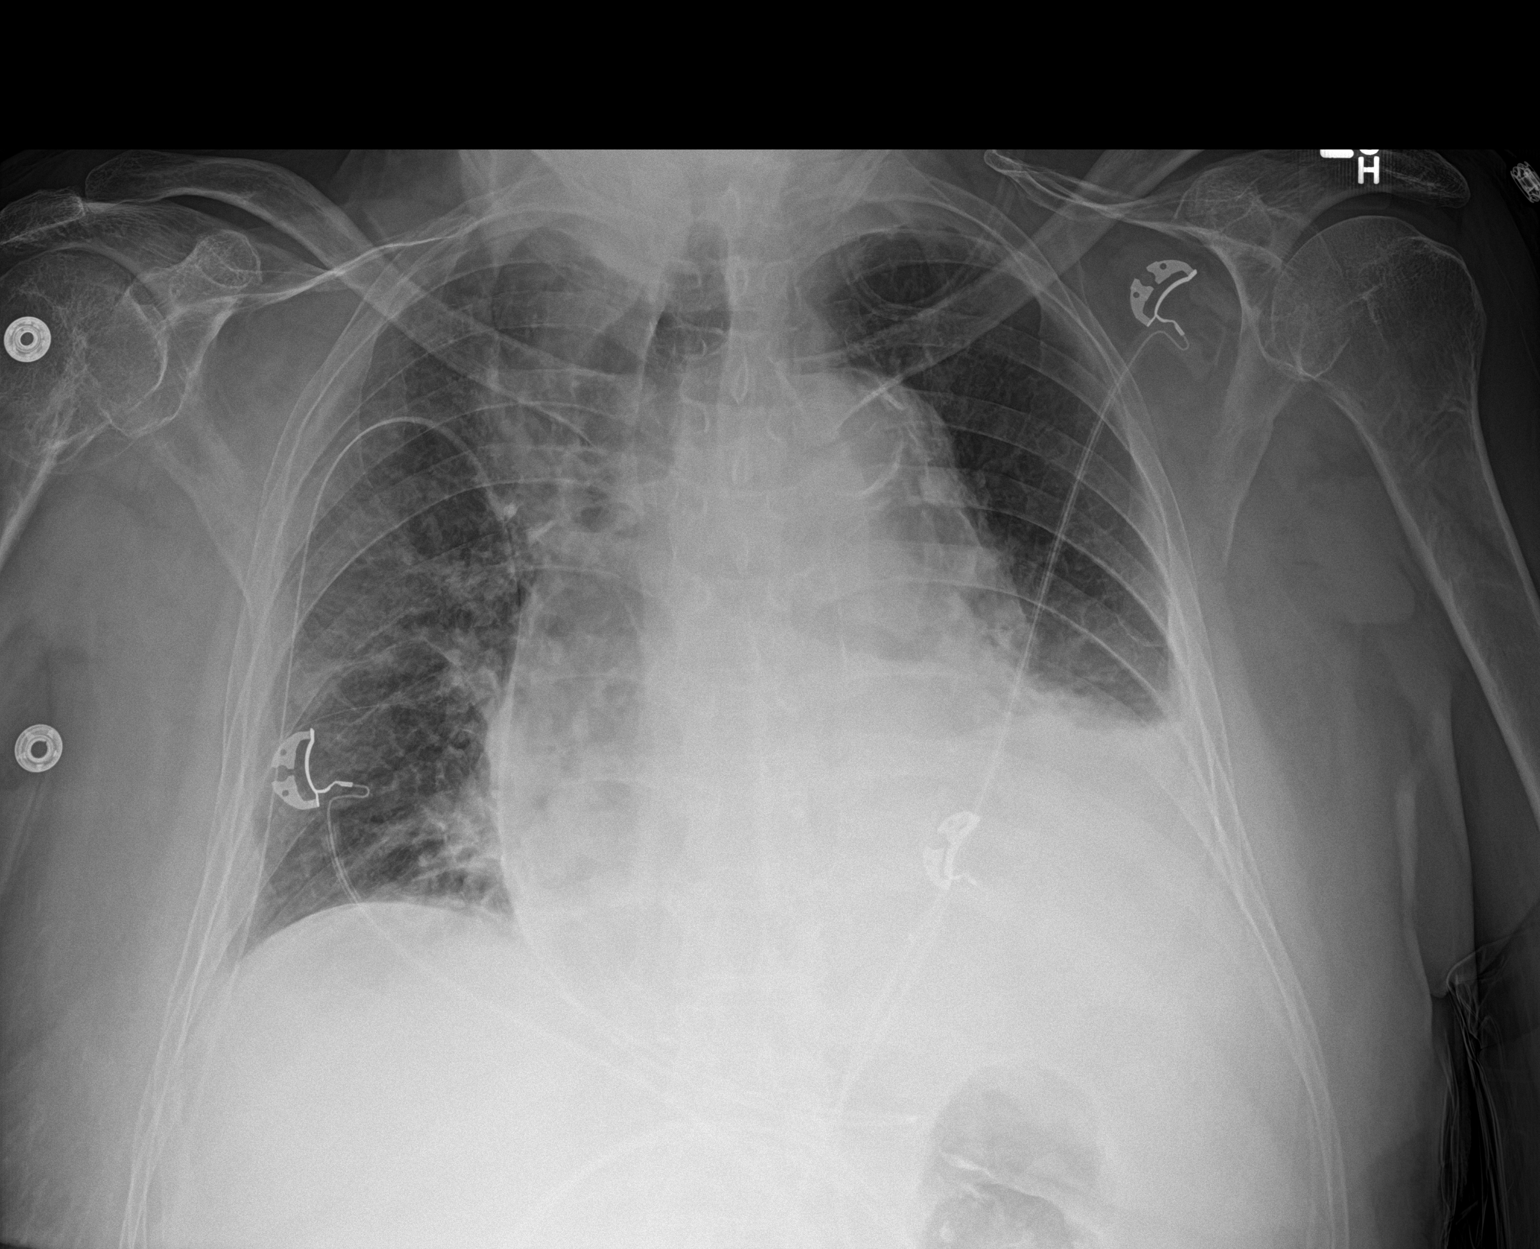

[1 of 1 positions shown; findings below may reference images not displayed]

FINDINGS: Right IJ central line has been removed. Pericardial window procedure
with pericardial drain in place. Cardiomediastinal silhouette is
enlarged, unchanged. Thoracic aorta is calcified. Left basilar
collapse/consolidation and moderate left pleural effusion,
unchanged. Mild central pulmonary vascular congestion.
IMPRESSION: 1. Pericardial window procedure with pericardial drain in place.
Cardiopericardial silhouette is stable in size and configuration.
2. Left basilar collapse/consolidation with a moderate left pleural
effusion, similar.
3. Mild central pulmonary vascular congestion.

## 2020-07-25 ENCOUNTER — Other Ambulatory Visit: Payer: Self-pay | Admitting: Cardiology

## 2020-07-26 NOTE — Telephone Encounter (Signed)
Metoprolol request denied, per Dr. Servando Salina notes 01/19/2020 patient was to d/c metoprolol and start Carvedilol. Pharmacy notified.

## 2020-07-28 ENCOUNTER — Other Ambulatory Visit: Payer: Self-pay | Admitting: Cardiology

## 2020-08-05 ENCOUNTER — Other Ambulatory Visit: Payer: Self-pay | Admitting: Cardiology

## 2020-08-07 ENCOUNTER — Other Ambulatory Visit: Payer: Self-pay | Admitting: Cardiology

## 2020-08-23 ENCOUNTER — Other Ambulatory Visit: Payer: Self-pay

## 2020-08-24 ENCOUNTER — Ambulatory Visit: Payer: Medicare HMO | Admitting: Cardiology

## 2020-08-24 ENCOUNTER — Telehealth: Payer: Self-pay | Admitting: Emergency Medicine
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
## 2020-09-08 ENCOUNTER — Other Ambulatory Visit: Payer: Self-pay

## 2020-09-08 ENCOUNTER — Encounter: Payer: Self-pay | Admitting: Cardiology

## 2020-09-08 ENCOUNTER — Ambulatory Visit (INDEPENDENT_AMBULATORY_CARE_PROVIDER_SITE_OTHER): Payer: Medicare HMO | Admitting: Cardiology

## 2020-09-08 VITALS — BP 120/62 | HR 76 | Ht 68.0 in | Wt 189.2 lb

## 2020-09-08 DIAGNOSIS — I313 Pericardial effusion (noninflammatory): Secondary | ICD-10-CM

## 2020-09-08 DIAGNOSIS — I3139 Other pericardial effusion (noninflammatory): Secondary | ICD-10-CM

## 2020-09-08 DIAGNOSIS — I480 Paroxysmal atrial fibrillation: Secondary | ICD-10-CM | POA: Diagnosis not present

## 2020-09-08 DIAGNOSIS — N1831 Chronic kidney disease, stage 3a: Secondary | ICD-10-CM

## 2020-09-08 DIAGNOSIS — I5020 Unspecified systolic (congestive) heart failure: Secondary | ICD-10-CM

## 2020-09-08 DIAGNOSIS — E785 Hyperlipidemia, unspecified: Secondary | ICD-10-CM

## 2020-09-08 LAB — BASIC METABOLIC PANEL
BUN/Creatinine Ratio: 9 — ABNORMAL LOW (ref 10–24)
BUN: 16 mg/dL (ref 8–27)
CO2: 26 mmol/L (ref 20–29)
Calcium: 8.9 mg/dL (ref 8.6–10.2)
Chloride: 97 mmol/L (ref 96–106)
Creatinine, Ser: 1.72 mg/dL — ABNORMAL HIGH (ref 0.76–1.27)
GFR calc Af Amer: 40 mL/min/{1.73_m2} — ABNORMAL LOW (ref >59)
GFR calc non Af Amer: 35 mL/min/{1.73_m2} — ABNORMAL LOW (ref >59)
Glucose: 105 mg/dL — ABNORMAL HIGH (ref 65–99)
Potassium: 3.5 mmol/L (ref 3.5–5.2)
Sodium: 138 mmol/L (ref 134–144)

## 2020-09-08 LAB — MAGNESIUM: Magnesium: 2.3 mg/dL (ref 1.6–2.3)
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
## 2020-09-08 NOTE — Patient Instructions (Signed)
Medication Instructions:  Your physician recommends that you continue on your current medications as directed. Please refer to the Current Medication list given to you today.  *If you need a refill on your cardiac medications before your next appointment, please call your pharmacy*   Lab Work: Your physician recommends that you return for lab work in:  BMET, MAGNESIUM  If you have labs (blood work) drawn today and your tests are completely normal, you will receive your results only by: Marland Kitchen MyChart Message (if you have MyChart) OR . A paper copy in the mail If you have any lab test that is abnormal or we need to change your treatment, we will call you to review the results.   Testing/Procedures: NONE   Follow-Up: At [facility], you and your health needs are our priority.  As part of our continuing mission to provide you with exceptional heart care, we have created designated Provider Care Teams.  These Care Teams include your primary Cardiologist (physician) and Advanced Practice Providers (APPs -  Physician Assistants and Nurse Practitioners) who all work together to provide you with the care you need, when you need it.  We recommend signing up for the patient portal called "MyChart".  Sign up information is provided on this After Visit Summary.  MyChart is used to connect with patients for Virtual Visits (Telemedicine).  Patients are able to view lab/test results, encounter notes, upcoming appointments, etc.  Non-urgent messages can be sent to your provider as well.   To learn more about what you can do with MyChart, go to [email or web address].    Your next appointment:   6 month(s)  The format for your next appointment:   In Person  Provider:   Thomasene Ripple, DO   Other Instructions

## 2020-12-19 ENCOUNTER — Other Ambulatory Visit: Payer: Self-pay | Admitting: Cardiology

## 2020-12-20 NOTE — Telephone Encounter (Signed)
75m, 85.8kg, scr 1.72 09/08/20, lovw/tobb 09/08/20

## 2021-03-08 ENCOUNTER — Other Ambulatory Visit: Payer: Self-pay

## 2021-03-08 ENCOUNTER — Encounter: Payer: Self-pay | Admitting: Cardiology

## 2021-03-08 ENCOUNTER — Ambulatory Visit (INDEPENDENT_AMBULATORY_CARE_PROVIDER_SITE_OTHER): Payer: 59 | Admitting: Cardiology

## 2021-03-08 VITALS — BP 110/56 | HR 51 | Ht 68.0 in | Wt 184.0 lb

## 2021-03-08 DIAGNOSIS — I3139 Other pericardial effusion (noninflammatory): Secondary | ICD-10-CM

## 2021-03-08 DIAGNOSIS — E785 Hyperlipidemia, unspecified: Secondary | ICD-10-CM | POA: Diagnosis not present

## 2021-03-08 DIAGNOSIS — I10 Essential (primary) hypertension: Secondary | ICD-10-CM

## 2021-03-08 DIAGNOSIS — I480 Paroxysmal atrial fibrillation: Secondary | ICD-10-CM

## 2021-03-08 DIAGNOSIS — R001 Bradycardia, unspecified: Secondary | ICD-10-CM

## 2021-03-08 DIAGNOSIS — I313 Pericardial effusion (noninflammatory): Secondary | ICD-10-CM | POA: Diagnosis not present

## 2021-03-08 DIAGNOSIS — I5020 Unspecified systolic (congestive) heart failure: Secondary | ICD-10-CM | POA: Diagnosis not present

## 2021-03-08 HISTORY — DX: Bradycardia, unspecified: R00.1

## 2021-03-08 LAB — BASIC METABOLIC PANEL
BUN/Creatinine Ratio: 9 — ABNORMAL LOW (ref 10–24)
BUN: 15 mg/dL (ref 8–27)
CO2: 25 mmol/L (ref 20–29)
Calcium: 9.4 mg/dL (ref 8.6–10.2)
Chloride: 99 mmol/L (ref 96–106)
Creatinine, Ser: 1.71 mg/dL — ABNORMAL HIGH (ref 0.76–1.27)
Glucose: 87 mg/dL (ref 65–99)
Potassium: 4.2 mmol/L (ref 3.5–5.2)
Sodium: 140 mmol/L (ref 134–144)
eGFR: 38 mL/min/{1.73_m2} — ABNORMAL LOW (ref >59)

## 2021-03-08 LAB — MAGNESIUM: Magnesium: 2.4 mg/dL — ABNORMAL HIGH (ref 1.6–2.3)

## 2021-03-08 MED ORDER — METOPROLOL SUCCINATE ER 25 MG PO TB24: 12.5000 mg | ORAL_TABLET | Freq: Every day | ORAL | 3 refills | Status: DC

## 2021-03-08 NOTE — Patient Instructions (Addendum)
Medication Instructions:  Your physician has recommended you make the following change in your medication:  DO NOT TAKE YOUR LOPRESSOR  DO NOT TAKE YOUR COREG DECREASE: TOPROL XL 12.5 mg 0.5 tablet by mouth daily.  *If you need a refill on your cardiac medications before your next appointment, please call your pharmacy*   Lab Work: Your physician recommends that you return for lab work in: TODAY BMP, Mag If you have labs (blood work) drawn today and your tests are completely normal, you will receive your results only by: MyChart Message (if you have MyChart) OR A paper copy in the mail If you have any lab test that is abnormal or we need to change your treatment, we will call you to review the results.   Testing/Procedures: None   Follow-Up: At [facility], you and your health needs are our priority.  As part of our continuing mission to provide you with exceptional heart care, we have created designated Provider Care Teams.  These Care Teams include your primary Cardiologist (physician) and Advanced Practice Providers (APPs -  Physician Assistants and Nurse Practitioners) who all work together to provide you with the care you need, when you need it.  We recommend signing up for the patient portal called "MyChart".  Sign up information is provided on this After Visit Summary.  MyChart is used to connect with patients for Virtual Visits (Telemedicine).  Patients are able to view lab/test results, encounter notes, upcoming appointments, etc.  Non-urgent messages can be sent to your provider as well.   To learn more about what you can do with MyChart, go to [email or web address].    Your next appointment:   6 month(s)  The format for your next appointment:   In Person  Provider:   Thomasene Ripple, MD   Other Instructions
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
## 2021-03-11 ENCOUNTER — Telehealth: Payer: Self-pay

## 2021-03-11 NOTE — Telephone Encounter (Signed)
Patient brought in medications to make sure what he is taking.  Med list has been updated.

## 2021-03-11 NOTE — Telephone Encounter (Signed)
Thank you :)

## 2021-03-18 ENCOUNTER — Telehealth: Payer: Self-pay

## 2021-03-18 ENCOUNTER — Other Ambulatory Visit: Payer: Self-pay

## 2021-03-18 ENCOUNTER — Ambulatory Visit (INDEPENDENT_AMBULATORY_CARE_PROVIDER_SITE_OTHER): Payer: 59

## 2021-03-18 DIAGNOSIS — I5020 Unspecified systolic (congestive) heart failure: Secondary | ICD-10-CM

## 2021-03-18 LAB — ECHOCARDIOGRAM COMPLETE
Area-P 1/2: 3.74 cm2
S' Lateral: 3.1 cm

## 2021-05-04 ENCOUNTER — Other Ambulatory Visit: Payer: Self-pay | Admitting: Cardiology

## 2021-07-08 ENCOUNTER — Other Ambulatory Visit: Payer: Self-pay | Admitting: Cardiology

## 2021-07-08 NOTE — Telephone Encounter (Signed)
Prescription refill request for Eliquis received. Indication:Afib Last office visit:8/22 Scr:1.7 Age: 87 Weight:83.5 kg  Prescription refilled

## 2021-09-13 ENCOUNTER — Ambulatory Visit: Payer: 59 | Admitting: Cardiology

## 2021-09-28 ENCOUNTER — Other Ambulatory Visit: Payer: Self-pay

## 2021-10-03 ENCOUNTER — Other Ambulatory Visit: Payer: Self-pay

## 2021-10-03 ENCOUNTER — Ambulatory Visit (INDEPENDENT_AMBULATORY_CARE_PROVIDER_SITE_OTHER): Payer: 59 | Admitting: Cardiology

## 2021-10-03 ENCOUNTER — Encounter: Payer: Self-pay | Admitting: Cardiology

## 2021-10-03 VITALS — BP 138/56 | HR 68 | Ht 68.0 in | Wt 182.2 lb

## 2021-10-03 DIAGNOSIS — I5030 Unspecified diastolic (congestive) heart failure: Secondary | ICD-10-CM | POA: Diagnosis not present

## 2021-10-03 DIAGNOSIS — I480 Paroxysmal atrial fibrillation: Secondary | ICD-10-CM | POA: Diagnosis not present

## 2021-10-03 DIAGNOSIS — I10 Essential (primary) hypertension: Secondary | ICD-10-CM | POA: Diagnosis not present

## 2021-10-03 NOTE — Patient Instructions (Signed)
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
## 2022-02-11 ENCOUNTER — Other Ambulatory Visit: Payer: Self-pay | Admitting: Cardiology

## 2022-02-11 DIAGNOSIS — I480 Paroxysmal atrial fibrillation: Secondary | ICD-10-CM

## 2022-02-13 NOTE — Telephone Encounter (Signed)
Eliquis 2.5mg  refill request received. Patient is 88 years old, weight-82.6kg, Crea-1.71 on 03/08/2021, Diagnosis-Afib, and last seen by Dr. Tomie China on 10/03/2021. Dose is appropriate based on dosing criteria. Will send in refill to requested pharmacy.

## 2022-02-24 ENCOUNTER — Other Ambulatory Visit: Payer: Self-pay | Admitting: Cardiology

## 2022-06-18 ENCOUNTER — Other Ambulatory Visit: Payer: Self-pay | Admitting: Cardiology

## 2022-08-10 ENCOUNTER — Other Ambulatory Visit: Payer: Self-pay | Admitting: Cardiology

## 2022-08-10 DIAGNOSIS — I480 Paroxysmal atrial fibrillation: Secondary | ICD-10-CM

## 2022-08-11 NOTE — Telephone Encounter (Signed)
Prescription refill request for Eliquis received. Indication:afib Last office visit:2/23 Scr:2.1 Age: 89 Weight:82.6  kg  Prescription refilled

## 2022-08-19 ENCOUNTER — Other Ambulatory Visit: Payer: Self-pay | Admitting: Cardiology

## 2022-09-23 ENCOUNTER — Other Ambulatory Visit: Payer: Self-pay | Admitting: Cardiology

## 2022-09-24 ENCOUNTER — Other Ambulatory Visit: Payer: Self-pay | Admitting: Cardiology

## 2022-09-25 NOTE — Telephone Encounter (Signed)
Rx refill sent to pharmacy. 

## 2022-09-28 ENCOUNTER — Other Ambulatory Visit: Payer: Self-pay

## 2022-10-04 ENCOUNTER — Ambulatory Visit: Payer: 59 | Admitting: Cardiology

## 2022-10-27 ENCOUNTER — Other Ambulatory Visit: Payer: Self-pay | Admitting: Cardiology

## 2022-10-31 ENCOUNTER — Other Ambulatory Visit: Payer: Self-pay | Admitting: Cardiology

## 2022-11-24 ENCOUNTER — Other Ambulatory Visit: Payer: Self-pay | Admitting: Cardiology

## 2022-11-24 NOTE — Telephone Encounter (Signed)
Rx for Metoprolol succinate 25 mg # 15 tablets only, patient needs appointment for future refills / 2nd attempt

## 2022-12-26 ENCOUNTER — Other Ambulatory Visit: Payer: Self-pay | Admitting: Cardiology

## 2023-09-01 ENCOUNTER — Other Ambulatory Visit: Payer: Self-pay | Admitting: Cardiology

## 2023-09-01 DIAGNOSIS — I480 Paroxysmal atrial fibrillation: Secondary | ICD-10-CM

## 2023-09-03 NOTE — Telephone Encounter (Signed)
Prescription refill request for Eliquis received. Indication:afib Last office visit:needs ov Scr:1.41  8/24 Age: 89 Weight:82.6  kg  Prescription refilled

## 2024-02-12 ENCOUNTER — Other Ambulatory Visit: Payer: Self-pay | Admitting: Cardiology

## 2024-02-12 DIAGNOSIS — I480 Paroxysmal atrial fibrillation: Secondary | ICD-10-CM

## 2024-02-12 NOTE — Telephone Encounter (Signed)
Prescription refill request for Eliquis received. Indication: Last office visit: Scr: Age:  Weight:

## 2024-08-29 ENCOUNTER — Other Ambulatory Visit: Payer: Self-pay | Admitting: Cardiology

## 2024-08-29 DIAGNOSIS — I480 Paroxysmal atrial fibrillation: Secondary | ICD-10-CM
